# Patient Record
Sex: Female | Born: 1960
Health system: Southern US, Community
[De-identification: ages and names within clinical notes are randomized; demographics above are authoritative.]

## PROBLEM LIST (undated history)

## (undated) DIAGNOSIS — G4730 Sleep apnea, unspecified: Secondary | ICD-10-CM

## (undated) DIAGNOSIS — I10 Essential (primary) hypertension: Secondary | ICD-10-CM

## (undated) DIAGNOSIS — R011 Cardiac murmur, unspecified: Secondary | ICD-10-CM

## (undated) HISTORY — DX: Sleep apnea, unspecified: G47.30

## (undated) HISTORY — DX: Cardiac murmur, unspecified: R01.1

## (undated) HISTORY — DX: Essential (primary) hypertension: I10

## (undated) HISTORY — PX: MOUTH SURGERY: SHX715

## (undated) HISTORY — PX: KNEE SURGERY: SHX244

---

## 2015-09-24 ENCOUNTER — Ambulatory Visit (INDEPENDENT_AMBULATORY_CARE_PROVIDER_SITE_OTHER): Payer: BLUE CROSS/BLUE SHIELD | Admitting: Family Medicine

## 2015-09-24 VITALS — BP 144/86 | HR 66 | Temp 98.4°F | Resp 18 | Ht 60.0 in | Wt 132.0 lb

## 2015-09-24 DIAGNOSIS — G5711 Meralgia paresthetica, right lower limb: Secondary | ICD-10-CM

## 2015-09-24 DIAGNOSIS — S39012A Strain of muscle, fascia and tendon of lower back, initial encounter: Secondary | ICD-10-CM

## 2015-09-24 DIAGNOSIS — R202 Paresthesia of skin: Secondary | ICD-10-CM | POA: Diagnosis not present

## 2015-09-24 DIAGNOSIS — Z23 Encounter for immunization: Secondary | ICD-10-CM | POA: Diagnosis not present

## 2015-09-24 DIAGNOSIS — R200 Anesthesia of skin: Secondary | ICD-10-CM

## 2015-09-24 DIAGNOSIS — I10 Essential (primary) hypertension: Secondary | ICD-10-CM | POA: Diagnosis not present

## 2015-09-24 MED ORDER — CYCLOBENZAPRINE HCL 5 MG PO TABS: ORAL_TABLET | ORAL | Status: DC

## 2015-09-24 MED ORDER — TRAMADOL HCL 50 MG PO TABS: 50.0000 mg | ORAL_TABLET | Freq: Four times a day (QID) | ORAL | Status: DC | PRN

## 2015-09-24 NOTE — Progress Notes (Signed)
Subjective:  This chart was scribed for Merri Ray, MD by Leandra Kern, Medical Scribe. This patient was seen in Room 12 and the patient's care was started at 3:12 PM.   Patient ID: Sarah Beasley, female    DOB: 31-May-1961, 55 y.o.   MRN: [id number]  Chief Complaint  Patient presents with  . Back Pain    since saturday off and on   . Leg Pain    rt leg-lump under skin over a yr  . Flu Vaccine    HPI HPI Comments: Sarah Beasley is a 54 y.o. female who presents to [facility] complaining of multiple things:   Back pain:  Pt notes that the pain started about 5 days ago. Pt notes that the pain is intermittent and is localized to the lower left area of the back and radiates to the front of the hip, however not down the leg. She states that trying to sit down exacerbates the pain severely. She reports having symptoms of urinary frequency at the onset of the symptoms, however this has resolved now. She is also experiencing sleep disturbance due to the severity of the pain. Pt denies bowel or urinary incontinence, unexpected weight loss, fever, chills, hematuria, numbness, weakness in the legs, blisters or rash to the area. Pt took aleve and heat wraps, however she only found very mild relief with both. Pt recently moved to [place] from [place], and the moved has involved lifting heavy boxes, however she denies any traumatic injuries to the area.    Leg pain: Pt notes that she feels a lump under the skin on the front of the right thigh, first noticed at least a year ago. She believes that the lump has moved up in location. Pt states that she also experiences associated numbness to the area that started a few months ago. She indicates that the numbness is localized to the front of her thigh and it is not experienced on the posterior side of the thigh or down the leg.   Right hand numbness: Pt states that she usually does experience numbness to the area at night time, however  she indicates that she recently started experiencing the numbness and tingling sensation of the area during the day time such as on the way to work. Pt is right hand dominant. Pt denies pain to the wrist, increased use of computer mouse than normal, or a hx of carpal tunnel syndrome.  Pt is right handed   HTN:  Pt notes that she was diagnosed with HTN previously. Pt does not recall what medication she is currently using. Pt denies chest pain, trouble breathing, headache, or dizziness  Pt works in [employer].    There are no active problems to display for this patient.  Past Medical History  Diagnosis Date  . Hypertension    Past Surgical History  Procedure Laterality Date  . Knee surgery Left    No Known Allergies Prior to Admission medications   Not on File   Social History   Social History  . Marital Status: Single    Spouse Name: N/A  . Number of Children: N/A  . Years of Education: N/A   Occupational History  . Not on file.   Social History Main Topics  . Smoking status: Never Smoker   . Smokeless tobacco: Not on file  . Alcohol Use: 3.0 oz/week    5 Standard drinks or equivalent per week  . Drug Use: No  .  Sexual Activity: Not on file   Other Topics Concern  . Not on file   Social History Narrative  . No narrative on file    Review of Systems  Constitutional: Negative for fever, chills and unexpected weight change.  Respiratory: Negative for shortness of breath.   Cardiovascular: Negative for chest pain.  Genitourinary: Negative for hematuria.  Musculoskeletal: Positive for myalgias and back pain. Negative for arthralgias.  Skin: Negative for rash.  Neurological: Positive for numbness. Negative for dizziness, weakness and headaches.       Objective:   Physical Exam  Constitutional: She is oriented to person, place, and time. She appears well-developed and well-nourished. No distress.  HENT:  Head: Normocephalic and atraumatic.  Eyes:  Conjunctivae and EOM are normal. Pupils are equal, round, and reactive to light.  Neck: Neck supple. Carotid bruit is not present.  Cardiovascular: Normal rate, regular rhythm, normal heart sounds and intact distal pulses.   Pulmonary/Chest: Effort normal and breath sounds normal.  Abdominal: Soft. She exhibits no pulsatile midline mass. There is no tenderness.  Musculoskeletal:  Right wrist: FROM. No focal bony tenderness. Negative tannal of the wrist. Mild positive phalen with tingling into thump. No thenar wasting.   Back: Heal and toe walk is normal.Negative seated straight leg, pain only to the back, does not radiate to the leg. SI joint is non tender. Sciatica is non tender. Tender on the left paraspinal only, no bony tenderness.   Left Hip: Slight discomfort in the lower lumbar area towards SI joint with hip ROM, but negative faber, and negative fadir.  Neurological: She is alert and oriented to person, place, and time. No cranial nerve deficit. She displays no Babinski's sign on the right side. She displays no Babinski's sign on the left side.  Reflex Scores:      Patellar reflexes are 2+ on the right side.      Achilles reflexes are 2+ on the right side and 2+ on the left side. Lower extremity strength is equal bilaterally.    Skin: Skin is warm and dry.  Small, mobile, approximately 5-6 mm, soft nodule under the skin of the right thigh.     Psychiatric: She has a normal mood and affect. Her behavior is normal.  Nursing note and vitals reviewed.   Filed Vitals:   09/24/15 1456  BP: 144/86  Pulse: 66  Temp: 98.4 F (36.9 C)  TempSrc: Oral  Resp: 18  Height: 5' (1.524 m)  Weight: 132 lb (59.875 kg)  SpO2: 97%      Assessment & Plan:   Sarah Beasley is a 54 y.o. female Low back strain, initial encounter - Plan: cyclobenzaprine (FLEXERIL) 5 MG tablet, traMADol (ULTRAM) 50 MG tablet  - suspected overuse injury. No red flags on exam, and with relatively acute nature,  deferred imaging at this time.  - trial of Flexeril, tramadol if needed for breakthrough pain,  Handout on home exercise program and symptomatic care. RTC precautions given and if persistent, then would consider imaging.  Needs flu shot - Plan: Flu Vaccine QUAD 36+ mos IM given.   Numbness and tingling in right hand  -  Suspected mild carpal tunnel syndrome. Trial of over-the-counter wrist splint, can wear this initially at night or at work if needed. Recheck in the next few weeks if this persists.  Meralgia paraesthetica, right  - avoid constrictive clothing, change positions frequently throughout the day, and follow-up in a few weeks to discuss further. Sooner if worse.  Essential hypertension  - Borderline.  Check home blood pressures, and if remaining over 140/90, call with previous medication so I can send that in.  Follow-up sooner if worsening.  Meds ordered this encounter  Medications  . cyclobenzaprine (FLEXERIL) 5 MG tablet    Sig: 1 pill by mouth up to every 8 hours as needed. Start with one pill by mouth each bedtime as needed due to sedation    Dispense:  15 tablet    Refill:  0  . traMADol (ULTRAM) 50 MG tablet    Sig: Take 1 tablet (50 mg total) by mouth every 6 (six) hours as needed.    Dispense:  20 tablet    Refill:  0   Patient Instructions  Keep a record of your blood pressures outside of the office and bring them to the next office visit, and let me know what medicine and what dose you were on prior. If running over 140/90 persistently - return so we can restart medication.   I suspect you have a low back strain with a recent move, which may also cause some inflammation or pain into your sacroiliac joint. See information on both these conditions as printed below. Continue Advil or Aleve over-the-counter, Flexeril up to every 8 hours as needed for muscle spasm (this medicine can cause sedation), and if needed for breakthrough pain, I did write a short course of  tramadol if needed. This medicine also can cause sedation, so be careful combining it with other sedating medicines. If your pain is not improved within the next week to 2 weeks at the most, return to recheck with possible x-rays. Return sooner if any worsening of your symptoms.  Your leg pain may be a condition called meralgia paresthetica, which can be associated with tight waistbands or constrictive clothing. Change positions throughout the day and avoid any tight waistbands or constrictive clothing for the next 2 weeks to see if this will help with her symptoms. Follow-up in the next few weeks to discuss this further.  Your right wrist pain may be some mild carpal tunnel syndrome. See information on this condition below. You can try an over-the-counter wrist brace to wear at bedtime, and as needed throughout the day to see if this helps her symptoms. Follow-up in the next few weeks as above to discuss this further.    Low Back Strain With Rehab A strain is an injury in which a tendon or muscle is torn. The muscles and tendons of the lower back are vulnerable to strains. However, these muscles and tendons are very strong and require a great force to be injured. Strains are classified into three categories. Grade 1 strains cause pain, but the tendon is not lengthened. Grade 2 strains include a lengthened ligament, due to the ligament being stretched or partially ruptured. With grade 2 strains there is still function, although the function may be decreased. Grade 3 strains involve a complete tear of the tendon or muscle, and function is usually impaired. SYMPTOMS   Pain in the lower back.  Pain that affects one side more than the other.  Pain that gets worse with movement and may be felt in the hip, buttocks, or back of the thigh.  Muscle spasms of the muscles in the back.  Swelling along the muscles of the back.  Loss of strength of the back muscles.  Crackling sound (crepitation) when the  muscles are touched. CAUSES  Lower back strains occur when a force is placed on  the muscles or tendons that is greater than they can handle. Common causes of injury include:  Prolonged overuse of the muscle-tendon units in the lower back, usually from incorrect posture.  A single violent injury or force applied to the back. RISK INCREASES WITH:  Sports that involve twisting forces on the spine or a lot of bending at the waist (football, rugby, weightlifting, bowling, golf, tennis, speed skating, racquetball, swimming, running, gymnastics, diving).  Poor strength and flexibility.  Failure to warm up properly before activity.  Family history of lower back pain or disk disorders.  Previous back injury or surgery (especially fusion).  Poor posture with lifting, especially heavy objects.  Prolonged sitting, especially with poor posture. PREVENTION   Learn and use proper posture when sitting or lifting (maintain proper posture when sitting, lift using the knees and legs, not at the waist).  Warm up and stretch properly before activity.  Allow for adequate recovery between workouts.  Maintain physical fitness:  Strength, flexibility, and endurance.  Cardiovascular fitness. PROGNOSIS  If treated properly, lower back strains usually heal within 6 weeks. RELATED COMPLICATIONS   Recurring symptoms, resulting in a chronic problem.  Chronic inflammation, scarring, and partial muscle-tendon tear.  Delayed healing or resolution of symptoms.  Prolonged disability. TREATMENT  Treatment first involves the use of ice and medicine, to reduce pain and inflammation. The use of strengthening and stretching exercises may help reduce pain with activity. These exercises may be performed at home or with a therapist. Severe injuries may require referral to a therapist for further evaluation and treatment, such as ultrasound. Your caregiver may advise that you wear a back brace or corset, to help  reduce pain and discomfort. Often, prolonged bed rest results in greater harm then benefit. Corticosteroid injections may be recommended. However, these should be reserved for the most serious cases. It is important to avoid using your back when lifting objects. At night, sleep on your back on a firm mattress with a pillow placed under your knees. If non-surgical treatment is unsuccessful, surgery may be needed.  MEDICATION   If pain medicine is needed, nonsteroidal anti-inflammatory medicines (aspirin and ibuprofen), or other minor pain relievers (acetaminophen), are often advised.  Do not take pain medicine for 7 days before surgery.  Prescription pain relievers may be given, if your caregiver thinks they are needed. Use only as directed and only as much as you need.  Ointments applied to the skin may be helpful.  Corticosteroid injections may be given by your caregiver. These injections should be reserved for the most serious cases, because they may only be given a certain number of times. HEAT AND COLD  Cold treatment (icing) should be applied for 10 to 15 minutes every 2 to 3 hours for inflammation and pain, and immediately after activity that aggravates your symptoms. Use ice packs or an ice massage.  Heat treatment may be used before performing stretching and strengthening activities prescribed by your caregiver, physical therapist, or athletic trainer. Use a heat pack or a warm water soak. SEEK MEDICAL CARE IF:   Symptoms get worse or do not improve in 2 to 4 weeks, despite treatment.  You develop numbness, weakness, or loss of bowel or bladder function.  New, unexplained symptoms develop. (Drugs used in treatment may produce side effects.) EXERCISES  RANGE OF MOTION (ROM) AND STRETCHING EXERCISES - Low Back Strain Most people with lower back pain will find that their symptoms get worse with excessive bending forward (flexion) or arching  at the lower back (extension). The exercises  which will help resolve your symptoms will focus on the opposite motion.  Your physician, physical therapist or athletic trainer will help you determine which exercises will be most helpful to resolve your lower back pain. Do not complete any exercises without first consulting with your caregiver. Discontinue any exercises which make your symptoms worse until you speak to your caregiver.  If you have pain, numbness or tingling which travels down into your buttocks, leg or foot, the goal of the therapy is for these symptoms to move closer to your back and eventually resolve. Sometimes, these leg symptoms will get better, but your lower back pain may worsen. This is typically an indication of progress in your rehabilitation. Be very alert to any changes in your symptoms and the activities in which you participated in the 24 hours prior to the change. Sharing this information with your caregiver will allow him/her to most efficiently treat your condition.  These exercises may help you when beginning to rehabilitate your injury. Your symptoms may resolve with or without further involvement from your physician, physical therapist or athletic trainer. While completing these exercises, remember:  Restoring tissue flexibility helps normal motion to return to the joints. This allows healthier, less painful movement and activity.  An effective stretch should be held for at least 30 seconds.  A stretch should never be painful. You should only feel a gentle lengthening or release in the stretched tissue. FLEXION RANGE OF MOTION AND STRETCHING EXERCISES: STRETCH - Flexion, Single Knee to Chest   Lie on a firm bed or floor with both legs extended in front of you.  Keeping one leg in contact with the floor, bring your opposite knee to your chest. Hold your leg in place by either grabbing behind your thigh or at your knee.  Pull until you feel a gentle stretch in your lower back. Hold __________ seconds.  Slowly  release your grasp and repeat the exercise with the opposite side. Repeat __________ times. Complete this exercise __________ times per day.  STRETCH - Flexion, Double Knee to Chest   Lie on a firm bed or floor with both legs extended in front of you.  Keeping one leg in contact with the floor, bring your opposite knee to your chest.  Tense your stomach muscles to support your back and then lift your other knee to your chest. Hold your legs in place by either grabbing behind your thighs or at your knees.  Pull both knees toward your chest until you feel a gentle stretch in your lower back. Hold __________ seconds.  Tense your stomach muscles and slowly return one leg at a time to the floor. Repeat __________ times. Complete this exercise __________ times per day.  STRETCH - Low Trunk Rotation  Lie on a firm bed or floor. Keeping your legs in front of you, bend your knees so they are both pointed toward the ceiling and your feet are flat on the floor.  Extend your arms out to the side. This will stabilize your upper body by keeping your shoulders in contact with the floor.  Gently and slowly drop both knees together to one side until you feel a gentle stretch in your lower back. Hold for __________ seconds.  Tense your stomach muscles to support your lower back as you bring your knees back to the starting position. Repeat the exercise to the other side. Repeat __________ times. Complete this exercise __________ times per day  EXTENSION RANGE OF MOTION AND FLEXIBILITY EXERCISES: STRETCH - Extension, Prone on Elbows   Lie on your stomach on the floor, a bed will be too soft. Place your palms about shoulder width apart and at the height of your head.  Place your elbows under your shoulders. If this is too painful, stack pillows under your chest.  Allow your body to relax so that your hips drop lower and make contact more completely with the floor.  Hold this position for __________  seconds.  Slowly return to lying flat on the floor. Repeat __________ times. Complete this exercise __________ times per day.  RANGE OF MOTION - Extension, Prone Press Ups  Lie on your stomach on the floor, a bed will be too soft. Place your palms about shoulder width apart and at the height of your head.  Keeping your back as relaxed as possible, slowly straighten your elbows while keeping your hips on the floor. You may adjust the placement of your hands to maximize your comfort. As you gain motion, your hands will come more underneath your shoulders.  Hold this position __________ seconds.  Slowly return to lying flat on the floor. Repeat __________ times. Complete this exercise __________ times per day.  RANGE OF MOTION- Quadruped, Neutral Spine   Assume a hands and knees position on a firm surface. Keep your hands under your shoulders and your knees under your hips. You may place padding under your knees for comfort.  Drop your head and point your tail bone toward the ground below you. This will round out your lower back like an angry cat. Hold this position for __________ seconds.  Slowly lift your head and release your tail bone so that your back sags into a large arch, like an old horse.  Hold this position for __________ seconds.  Repeat this until you feel limber in your lower back.  Now, find your "sweet spot." This will be the most comfortable position somewhere between the two previous positions. This is your neutral spine. Once you have found this position, tense your stomach muscles to support your lower back.  Hold this position for __________ seconds. Repeat __________ times. Complete this exercise __________ times per day.  STRENGTHENING EXERCISES - Low Back Strain These exercises may help you when beginning to rehabilitate your injury. These exercises should be done near your "sweet spot." This is the neutral, low-back arch, somewhere between fully rounded and fully  arched, that is your least painful position. When performed in this safe range of motion, these exercises can be used for people who have either a flexion or extension based injury. These exercises may resolve your symptoms with or without further involvement from your physician, physical therapist or athletic trainer. While completing these exercises, remember:   Muscles can gain both the endurance and the strength needed for everyday activities through controlled exercises.  Complete these exercises as instructed by your physician, physical therapist or athletic trainer. Increase the resistance and repetitions only as guided.  You may experience muscle soreness or fatigue, but the pain or discomfort you are trying to eliminate should never worsen during these exercises. If this pain does worsen, stop and make certain you are following the directions exactly. If the pain is still present after adjustments, discontinue the exercise until you can discuss the trouble with your caregiver. STRENGTHENING - Deep Abdominals, Pelvic Tilt  Lie on a firm bed or floor. Keeping your legs in front of you, bend your knees so they are  both pointed toward the ceiling and your feet are flat on the floor.  Tense your lower abdominal muscles to press your lower back into the floor. This motion will rotate your pelvis so that your tail bone is scooping upwards rather than pointing at your feet or into the floor.  With a gentle tension and even breathing, hold this position for __________ seconds. Repeat __________ times. Complete this exercise __________ times per day.  STRENGTHENING - Abdominals, Crunches   Lie on a firm bed or floor. Keeping your legs in front of you, bend your knees so they are both pointed toward the ceiling and your feet are flat on the floor. Cross your arms over your chest.  Slightly tip your chin down without bending your neck.  Tense your abdominals and slowly lift your trunk high enough  to just clear your shoulder blades. Lifting higher can put excessive stress on the lower back and does not further strengthen your abdominal muscles.  Control your return to the starting position. Repeat __________ times. Complete this exercise __________ times per day.  STRENGTHENING - Quadruped, Opposite UE/LE Lift   Assume a hands and knees position on a firm surface. Keep your hands under your shoulders and your knees under your hips. You may place padding under your knees for comfort.  Find your neutral spine and gently tense your abdominal muscles so that you can maintain this position. Your shoulders and hips should form a rectangle that is parallel with the floor and is not twisted.  Keeping your trunk steady, lift your right hand no higher than your shoulder and then your left leg no higher than your hip. Make sure you are not holding your breath. Hold this position __________ seconds.  Continuing to keep your abdominal muscles tense and your back steady, slowly return to your starting position. Repeat with the opposite arm and leg. Repeat __________ times. Complete this exercise __________ times per day.  STRENGTHENING - Lower Abdominals, Double Knee Lift  Lie on a firm bed or floor. Keeping your legs in front of you, bend your knees so they are both pointed toward the ceiling and your feet are flat on the floor.  Tense your abdominal muscles to brace your lower back and slowly lift both of your knees until they come over your hips. Be certain not to hold your breath.  Hold __________ seconds. Using your abdominal muscles, return to the starting position in a slow and controlled manner. Repeat __________ times. Complete this exercise __________ times per day.  POSTURE AND BODY MECHANICS CONSIDERATIONS - Low Back Strain Keeping correct posture when sitting, standing or completing your activities will reduce the stress put on different body tissues, allowing injured tissues a chance to  heal and limiting painful experiences. The following are general guidelines for improved posture. Your physician or physical therapist will provide you with any instructions specific to your needs. While reading these guidelines, remember:  The exercises prescribed by your provider will help you have the flexibility and strength to maintain correct postures.  The correct posture provides the best environment for your joints to work. All of your joints have less wear and tear when properly supported by a spine with good posture. This means you will experience a healthier, less painful body.  Correct posture must be practiced with all of your activities, especially prolonged sitting and standing. Correct posture is as important when doing repetitive low-stress activities (typing) as it is when doing a single heavy-load activity (lifting).  RESTING POSITIONS Consider which positions are most painful for you when choosing a resting position. If you have pain with flexion-based activities (sitting, bending, stooping, squatting), choose a position that allows you to rest in a less flexed posture. You would want to avoid curling into a fetal position on your side. If your pain worsens with extension-based activities (prolonged standing, working overhead), avoid resting in an extended position such as sleeping on your stomach. Most people will find more comfort when they rest with their spine in a more neutral position, neither too rounded nor too arched. Lying on a non-sagging bed on your side with a pillow between your knees, or on your back with a pillow under your knees will often provide some relief. Keep in mind, being in any one position for a prolonged period of time, no matter how correct your posture, can still lead to stiffness. PROPER SITTING POSTURE In order to minimize stress and discomfort on your spine, you must sit with correct posture. Sitting with good posture should be effortless for a healthy  body. Returning to good posture is a gradual process. Many people can work toward this most comfortably by using various supports until they have the flexibility and strength to maintain this posture on their own. When sitting with proper posture, your ears will fall over your shoulders and your shoulders will fall over your hips. You should use the back of the chair to support your upper back. Your lower back will be in a neutral position, just slightly arched. You may place a small pillow or folded towel at the base of your lower back for support.  When working at a desk, create an environment that supports good, upright posture. Without extra support, muscles tire, which leads to excessive strain on joints and other tissues. Keep these recommendations in mind: CHAIR:  A chair should be able to slide under your desk when your back makes contact with the back of the chair. This allows you to work closely.  The chair's height should allow your eyes to be level with the upper part of your monitor and your hands to be slightly lower than your elbows. BODY POSITION  Your feet should make contact with the floor. If this is not possible, use a foot rest.  Keep your ears over your shoulders. This will reduce stress on your neck and lower back. INCORRECT SITTING POSTURES  If you are feeling tired and unable to assume a healthy sitting posture, do not slouch or slump. This puts excessive strain on your back tissues, causing more damage and pain. Healthier options include:  Using more support, like a lumbar pillow.  Switching tasks to something that requires you to be upright or walking.  Talking a brief walk.  Lying down to rest in a neutral-spine position. PROLONGED STANDING WHILE SLIGHTLY LEANING FORWARD  When completing a task that requires you to lean forward while standing in one place for a long time, place either foot up on a stationary 2-4 inch high object to help maintain the best posture.  When both feet are on the ground, the lower back tends to lose its slight inward curve. If this curve flattens (or becomes too large), then the back and your other joints will experience too much stress, tire more quickly, and can cause pain. CORRECT STANDING POSTURES Proper standing posture should be assumed with all daily activities, even if they only take a few moments, like when brushing your teeth. As in sitting, your ears  should fall over your shoulders and your shoulders should fall over your hips. You should keep a slight tension in your abdominal muscles to brace your spine. Your tailbone should point down to the ground, not behind your body, resulting in an over-extended swayback posture.  INCORRECT STANDING POSTURES  Common incorrect standing postures include a forward head, locked knees and/or an excessive swayback. WALKING Walk with an upright posture. Your ears, shoulders and hips should all line-up. PROLONGED ACTIVITY IN A FLEXED POSITION When completing a task that requires you to bend forward at your waist or lean over a low surface, try to find a way to stabilize 3 out of 4 of your limbs. You can place a hand or elbow on your thigh or rest a knee on the surface you are reaching across. This will provide you more stability so that your muscles do not fatigue as quickly. By keeping your knees relaxed, or slightly bent, you will also reduce stress across your lower back. CORRECT LIFTING TECHNIQUES DO :   Assume a wide stance. This will provide you more stability and the opportunity to get as close as possible to the object which you are lifting.  Tense your abdominals to brace your spine. Bend at the knees and hips. Keeping your back locked in a neutral-spine position, lift using your leg muscles. Lift with your legs, keeping your back straight.  Test the weight of unknown objects before attempting to lift them.  Try to keep your elbows locked down at your sides in order get the best  strength from your shoulders when carrying an object.  Always ask for help when lifting heavy or awkward objects. INCORRECT LIFTING TECHNIQUES DO NOT:   Lock your knees when lifting, even if it is a small object.  Bend and twist. Pivot at your feet or move your feet when needing to change directions.  Assume that you can safely pick up even a paper clip without proper posture.   This information is not intended to replace advice given to you by your health care provider. Make sure you discuss any questions you have with your health care provider.   Document Released: 10/24/2005 Document Revised: 11/14/2014 Document Reviewed: 02/05/2009 Elsevier Interactive Patient Education 2016 [place] Syndrome Carpal tunnel syndrome is a condition that causes pain in your hand and arm. The carpal tunnel is a narrow area located on the palm side of your wrist. Repeated wrist motion or certain diseases may cause swelling within the tunnel. This swelling pinches the main nerve in the wrist (median nerve). CAUSES  This condition may be caused by:   Repeated wrist motions.  Wrist injuries.  Arthritis.  A cyst or tumor in the carpal tunnel.  Fluid buildup during pregnancy. Sometimes the cause of this condition is not known.  RISK FACTORS This condition is more likely to develop in:   People who have jobs that cause them to repeatedly move their wrists in the same motion, such as butchers and cashiers.  Women.  People with certain conditions, such as:  Diabetes.  Obesity.  An underactive thyroid (hypothyroidism).  Kidney failure. SYMPTOMS  Symptoms of this condition include:   A tingling feeling in your fingers, especially in your thumb, index, and middle fingers.  Tingling or numbness in your hand.  An aching feeling in your entire arm, especially when your wrist and elbow are bent for long periods of time.  Wrist pain that goes up your arm to your  shoulder.  Pain that goes down into your palm or fingers.  A weak feeling in your hands. You may have trouble grabbing and holding items. Your symptoms may feel worse during the night.  DIAGNOSIS  This condition is diagnosed with a medical history and physical exam. You may also have tests, including:   An electromyogram (EMG). This test measures electrical signals sent by your nerves into the muscles.  X-rays. TREATMENT  Treatment for this condition includes:  Lifestyle changes. It is important to stop doing or modify the activity that caused your condition.  Physical or occupational therapy.  Medicines for pain and inflammation. This may include medicine that is injected into your wrist.  A wrist splint.  Surgery. HOME CARE INSTRUCTIONS  If You Have a Splint:  Wear it as told by your health care provider. Remove it only as told by your health care provider.  Loosen the splint if your fingers become numb and tingle, or if they turn cold and blue.  Keep the splint clean and dry. General Instructions  Take over-the-counter and prescription medicines only as told by your health care provider.  Rest your wrist from any activity that may be causing your pain. If your condition is work related, talk to your employer about changes that can be made, such as getting a wrist pad to use while typing.  If directed, apply ice to the painful area:  Put ice in a plastic bag.  Place a towel between your skin and the bag.  Leave the ice on for 20 minutes, 2-3 times per day.  Keep all follow-up visits as told by your health care provider. This is important.  Do any exercises as told by your health care provider, physical therapist, or occupational therapist. [place] IF:   You have new symptoms.  Your pain is not controlled with medicines.  Your symptoms get worse.   This information is not intended to replace advice given to you by your health care provider. Make  sure you discuss any questions you have with your health care provider.   Document Released: 10/21/2000 Document Revised: 07/15/2015 Document Reviewed: 03/11/2015 Elsevier Interactive Patient Education [employer].        By signing my name below, I, Rawaa Al Rifaie, attest that this documentation has been prepared under the direction and in the presence of Merri Ray, MD.  Royann Shivers Rifaie, Medical Scribe. 09/24/2015.  3:39 PM. I personally performed the services described in this documentation, which was scribed in my presence. The recorded information has been reviewed and considered, and addended by me as needed.

## 2015-09-24 NOTE — Patient Instructions (Signed)
Keep a record of your blood pressures outside of the office and bring them to the next office visit, and let me know what medicine and what dose you were on prior. If running over 140/90 persistently - return so we can restart medication.   I suspect you have a low back strain with a recent move, which may also cause some inflammation or pain into your sacroiliac joint. See information on both these conditions as printed below. Continue Advil or Aleve over-the-counter, Flexeril up to every 8 hours as needed for muscle spasm (this medicine can cause sedation), and if needed for breakthrough pain, I did write a short course of tramadol if needed. This medicine also can cause sedation, so be careful combining it with other sedating medicines. If your pain is not improved within the next week to 2 weeks at the most, return to recheck with possible x-rays. Return sooner if any worsening of your symptoms.  Your leg pain may be a condition called meralgia paresthetica, which can be associated with tight waistbands or constrictive clothing. Change positions throughout the day and avoid any tight waistbands or constrictive clothing for the next 2 weeks to see if this will help with her symptoms. Follow-up in the next few weeks to discuss this further.  Your right wrist pain may be some mild carpal tunnel syndrome. See information on this condition below. You can try an over-the-counter wrist brace to wear at bedtime, and as needed throughout the day to see if this helps her symptoms. Follow-up in the next few weeks as above to discuss this further.    Low Back Strain With Rehab A strain is an injury in which a tendon or muscle is torn. The muscles and tendons of the lower back are vulnerable to strains. However, these muscles and tendons are very strong and require a great force to be injured. Strains are classified into three categories. Grade 1 strains cause pain, but the tendon is not lengthened. Grade 2 strains  include a lengthened ligament, due to the ligament being stretched or partially ruptured. With grade 2 strains there is still function, although the function may be decreased. Grade 3 strains involve a complete tear of the tendon or muscle, and function is usually impaired. SYMPTOMS   Pain in the lower back.  Pain that affects one side more than the other.  Pain that gets worse with movement and may be felt in the hip, buttocks, or back of the thigh.  Muscle spasms of the muscles in the back.  Swelling along the muscles of the back.  Loss of strength of the back muscles.  Crackling sound (crepitation) when the muscles are touched. CAUSES  Lower back strains occur when a force is placed on the muscles or tendons that is greater than they can handle. Common causes of injury include:  Prolonged overuse of the muscle-tendon units in the lower back, usually from incorrect posture.  A single violent injury or force applied to the back. RISK INCREASES WITH:  Sports that involve twisting forces on the spine or a lot of bending at the waist (football, rugby, weightlifting, bowling, golf, tennis, speed skating, racquetball, swimming, running, gymnastics, diving).  Poor strength and flexibility.  Failure to warm up properly before activity.  Family history of lower back pain or disk disorders.  Previous back injury or surgery (especially fusion).  Poor posture with lifting, especially heavy objects.  Prolonged sitting, especially with poor posture. PREVENTION   Learn and use proper posture  when sitting or lifting (maintain proper posture when sitting, lift using the knees and legs, not at the waist).  Warm up and stretch properly before activity.  Allow for adequate recovery between workouts.  Maintain physical fitness:  Strength, flexibility, and endurance.  Cardiovascular fitness. PROGNOSIS  If treated properly, lower back strains usually heal within 6 weeks. RELATED  COMPLICATIONS   Recurring symptoms, resulting in a chronic problem.  Chronic inflammation, scarring, and partial muscle-tendon tear.  Delayed healing or resolution of symptoms.  Prolonged disability. TREATMENT  Treatment first involves the use of ice and medicine, to reduce pain and inflammation. The use of strengthening and stretching exercises may help reduce pain with activity. These exercises may be performed at home or with a therapist. Severe injuries may require referral to a therapist for further evaluation and treatment, such as ultrasound. Your caregiver may advise that you wear a back brace or corset, to help reduce pain and discomfort. Often, prolonged bed rest results in greater harm then benefit. Corticosteroid injections may be recommended. However, these should be reserved for the most serious cases. It is important to avoid using your back when lifting objects. At night, sleep on your back on a firm mattress with a pillow placed under your knees. If non-surgical treatment is unsuccessful, surgery may be needed.  MEDICATION   If pain medicine is needed, nonsteroidal anti-inflammatory medicines (aspirin and ibuprofen), or other minor pain relievers (acetaminophen), are often advised.  Do not take pain medicine for 7 days before surgery.  Prescription pain relievers may be given, if your caregiver thinks they are needed. Use only as directed and only as much as you need.  Ointments applied to the skin may be helpful.  Corticosteroid injections may be given by your caregiver. These injections should be reserved for the most serious cases, because they may only be given a certain number of times. HEAT AND COLD  Cold treatment (icing) should be applied for 10 to 15 minutes every 2 to 3 hours for inflammation and pain, and immediately after activity that aggravates your symptoms. Use ice packs or an ice massage.  Heat treatment may be used before performing stretching and  strengthening activities prescribed by your caregiver, physical therapist, or athletic trainer. Use a heat pack or a warm water soak. SEEK MEDICAL CARE IF:   Symptoms get worse or do not improve in 2 to 4 weeks, despite treatment.  You develop numbness, weakness, or loss of bowel or bladder function.  New, unexplained symptoms develop. (Drugs used in treatment may produce side effects.) EXERCISES  RANGE OF MOTION (ROM) AND STRETCHING EXERCISES - Low Back Strain Most people with lower back pain will find that their symptoms get worse with excessive bending forward (flexion) or arching at the lower back (extension). The exercises which will help resolve your symptoms will focus on the opposite motion.  Your physician, physical therapist or athletic trainer will help you determine which exercises will be most helpful to resolve your lower back pain. Do not complete any exercises without first consulting with your caregiver. Discontinue any exercises which make your symptoms worse until you speak to your caregiver.  If you have pain, numbness or tingling which travels down into your buttocks, leg or foot, the goal of the therapy is for these symptoms to move closer to your back and eventually resolve. Sometimes, these leg symptoms will get better, but your lower back pain may worsen. This is typically an indication of progress in your rehabilitation. Be  very alert to any changes in your symptoms and the activities in which you participated in the 24 hours prior to the change. Sharing this information with your caregiver will allow him/her to most efficiently treat your condition.  These exercises may help you when beginning to rehabilitate your injury. Your symptoms may resolve with or without further involvement from your physician, physical therapist or athletic trainer. While completing these exercises, remember:  Restoring tissue flexibility helps normal motion to return to the joints. This allows  healthier, less painful movement and activity.  An effective stretch should be held for at least 30 seconds.  A stretch should never be painful. You should only feel a gentle lengthening or release in the stretched tissue. FLEXION RANGE OF MOTION AND STRETCHING EXERCISES: STRETCH - Flexion, Single Knee to Chest   Lie on a firm bed or floor with both legs extended in front of you.  Keeping one leg in contact with the floor, bring your opposite knee to your chest. Hold your leg in place by either grabbing behind your thigh or at your knee.  Pull until you feel a gentle stretch in your lower back. Hold __________ seconds.  Slowly release your grasp and repeat the exercise with the opposite side. Repeat __________ times. Complete this exercise __________ times per day.  STRETCH - Flexion, Double Knee to Chest   Lie on a firm bed or floor with both legs extended in front of you.  Keeping one leg in contact with the floor, bring your opposite knee to your chest.  Tense your stomach muscles to support your back and then lift your other knee to your chest. Hold your legs in place by either grabbing behind your thighs or at your knees.  Pull both knees toward your chest until you feel a gentle stretch in your lower back. Hold __________ seconds.  Tense your stomach muscles and slowly return one leg at a time to the floor. Repeat __________ times. Complete this exercise __________ times per day.  STRETCH - Low Trunk Rotation  Lie on a firm bed or floor. Keeping your legs in front of you, bend your knees so they are both pointed toward the ceiling and your feet are flat on the floor.  Extend your arms out to the side. This will stabilize your upper body by keeping your shoulders in contact with the floor.  Gently and slowly drop both knees together to one side until you feel a gentle stretch in your lower back. Hold for __________ seconds.  Tense your stomach muscles to support your lower  back as you bring your knees back to the starting position. Repeat the exercise to the other side. Repeat __________ times. Complete this exercise __________ times per day  EXTENSION RANGE OF MOTION AND FLEXIBILITY EXERCISES: STRETCH - Extension, Prone on Elbows   Lie on your stomach on the floor, a bed will be too soft. Place your palms about shoulder width apart and at the height of your head.  Place your elbows under your shoulders. If this is too painful, stack pillows under your chest.  Allow your body to relax so that your hips drop lower and make contact more completely with the floor.  Hold this position for __________ seconds.  Slowly return to lying flat on the floor. Repeat __________ times. Complete this exercise __________ times per day.  RANGE OF MOTION - Extension, Prone Press Ups  Lie on your stomach on the floor, a bed will be too soft.  Place your palms about shoulder width apart and at the height of your head.  Keeping your back as relaxed as possible, slowly straighten your elbows while keeping your hips on the floor. You may adjust the placement of your hands to maximize your comfort. As you gain motion, your hands will come more underneath your shoulders.  Hold this position __________ seconds.  Slowly return to lying flat on the floor. Repeat __________ times. Complete this exercise __________ times per day.  RANGE OF MOTION- Quadruped, Neutral Spine   Assume a hands and knees position on a firm surface. Keep your hands under your shoulders and your knees under your hips. You may place padding under your knees for comfort.  Drop your head and point your tail bone toward the ground below you. This will round out your lower back like an angry cat. Hold this position for __________ seconds.  Slowly lift your head and release your tail bone so that your back sags into a large arch, like an old horse.  Hold this position for __________ seconds.  Repeat this until  you feel limber in your lower back.  Now, find your "sweet spot." This will be the most comfortable position somewhere between the two previous positions. This is your neutral spine. Once you have found this position, tense your stomach muscles to support your lower back.  Hold this position for __________ seconds. Repeat __________ times. Complete this exercise __________ times per day.  STRENGTHENING EXERCISES - Low Back Strain These exercises may help you when beginning to rehabilitate your injury. These exercises should be done near your "sweet spot." This is the neutral, low-back arch, somewhere between fully rounded and fully arched, that is your least painful position. When performed in this safe range of motion, these exercises can be used for people who have either a flexion or extension based injury. These exercises may resolve your symptoms with or without further involvement from your physician, physical therapist or athletic trainer. While completing these exercises, remember:   Muscles can gain both the endurance and the strength needed for everyday activities through controlled exercises.  Complete these exercises as instructed by your physician, physical therapist or athletic trainer. Increase the resistance and repetitions only as guided.  You may experience muscle soreness or fatigue, but the pain or discomfort you are trying to eliminate should never worsen during these exercises. If this pain does worsen, stop and make certain you are following the directions exactly. If the pain is still present after adjustments, discontinue the exercise until you can discuss the trouble with your caregiver. STRENGTHENING - Deep Abdominals, Pelvic Tilt  Lie on a firm bed or floor. Keeping your legs in front of you, bend your knees so they are both pointed toward the ceiling and your feet are flat on the floor.  Tense your lower abdominal muscles to press your lower back into the floor. This  motion will rotate your pelvis so that your tail bone is scooping upwards rather than pointing at your feet or into the floor.  With a gentle tension and even breathing, hold this position for __________ seconds. Repeat __________ times. Complete this exercise __________ times per day.  STRENGTHENING - Abdominals, Crunches   Lie on a firm bed or floor. Keeping your legs in front of you, bend your knees so they are both pointed toward the ceiling and your feet are flat on the floor. Cross your arms over your chest.  Slightly tip your chin down without  bending your neck.  Tense your abdominals and slowly lift your trunk high enough to just clear your shoulder blades. Lifting higher can put excessive stress on the lower back and does not further strengthen your abdominal muscles.  Control your return to the starting position. Repeat __________ times. Complete this exercise __________ times per day.  STRENGTHENING - Quadruped, Opposite UE/LE Lift   Assume a hands and knees position on a firm surface. Keep your hands under your shoulders and your knees under your hips. You may place padding under your knees for comfort.  Find your neutral spine and gently tense your abdominal muscles so that you can maintain this position. Your shoulders and hips should form a rectangle that is parallel with the floor and is not twisted.  Keeping your trunk steady, lift your right hand no higher than your shoulder and then your left leg no higher than your hip. Make sure you are not holding your breath. Hold this position __________ seconds.  Continuing to keep your abdominal muscles tense and your back steady, slowly return to your starting position. Repeat with the opposite arm and leg. Repeat __________ times. Complete this exercise __________ times per day.  STRENGTHENING - Lower Abdominals, Double Knee Lift  Lie on a firm bed or floor. Keeping your legs in front of you, bend your knees so they are both  pointed toward the ceiling and your feet are flat on the floor.  Tense your abdominal muscles to brace your lower back and slowly lift both of your knees until they come over your hips. Be certain not to hold your breath.  Hold __________ seconds. Using your abdominal muscles, return to the starting position in a slow and controlled manner. Repeat __________ times. Complete this exercise __________ times per day.  POSTURE AND BODY MECHANICS CONSIDERATIONS - Low Back Strain Keeping correct posture when sitting, standing or completing your activities will reduce the stress put on different body tissues, allowing injured tissues a chance to heal and limiting painful experiences. The following are general guidelines for improved posture. Your physician or physical therapist will provide you with any instructions specific to your needs. While reading these guidelines, remember:  The exercises prescribed by your provider will help you have the flexibility and strength to maintain correct postures.  The correct posture provides the best environment for your joints to work. All of your joints have less wear and tear when properly supported by a spine with good posture. This means you will experience a healthier, less painful body.  Correct posture must be practiced with all of your activities, especially prolonged sitting and standing. Correct posture is as important when doing repetitive low-stress activities (typing) as it is when doing a single heavy-load activity (lifting). RESTING POSITIONS Consider which positions are most painful for you when choosing a resting position. If you have pain with flexion-based activities (sitting, bending, stooping, squatting), choose a position that allows you to rest in a less flexed posture. You would want to avoid curling into a fetal position on your side. If your pain worsens with extension-based activities (prolonged standing, working overhead), avoid resting in an  extended position such as sleeping on your stomach. Most people will find more comfort when they rest with their spine in a more neutral position, neither too rounded nor too arched. Lying on a non-sagging bed on your side with a pillow between your knees, or on your back with a pillow under your knees will often provide some relief. Keep  in mind, being in any one position for a prolonged period of time, no matter how correct your posture, can still lead to stiffness. PROPER SITTING POSTURE In order to minimize stress and discomfort on your spine, you must sit with correct posture. Sitting with good posture should be effortless for a healthy body. Returning to good posture is a gradual process. Many people can work toward this most comfortably by using various supports until they have the flexibility and strength to maintain this posture on their own. When sitting with proper posture, your ears will fall over your shoulders and your shoulders will fall over your hips. You should use the back of the chair to support your upper back. Your lower back will be in a neutral position, just slightly arched. You may place a small pillow or folded towel at the base of your lower back for support.  When working at a desk, create an environment that supports good, upright posture. Without extra support, muscles tire, which leads to excessive strain on joints and other tissues. Keep these recommendations in mind: CHAIR:  A chair should be able to slide under your desk when your back makes contact with the back of the chair. This allows you to work closely.  The chair's height should allow your eyes to be level with the upper part of your monitor and your hands to be slightly lower than your elbows. BODY POSITION  Your feet should make contact with the floor. If this is not possible, use a foot rest.  Keep your ears over your shoulders. This will reduce stress on your neck and lower back. INCORRECT SITTING POSTURES   If you are feeling tired and unable to assume a healthy sitting posture, do not slouch or slump. This puts excessive strain on your back tissues, causing more damage and pain. Healthier options include:  Using more support, like a lumbar pillow.  Switching tasks to something that requires you to be upright or walking.  Talking a brief walk.  Lying down to rest in a neutral-spine position. PROLONGED STANDING WHILE SLIGHTLY LEANING FORWARD  When completing a task that requires you to lean forward while standing in one place for a long time, place either foot up on a stationary 2-4 inch high object to help maintain the best posture. When both feet are on the ground, the lower back tends to lose its slight inward curve. If this curve flattens (or becomes too large), then the back and your other joints will experience too much stress, tire more quickly, and can cause pain. CORRECT STANDING POSTURES Proper standing posture should be assumed with all daily activities, even if they only take a few moments, like when brushing your teeth. As in sitting, your ears should fall over your shoulders and your shoulders should fall over your hips. You should keep a slight tension in your abdominal muscles to brace your spine. Your tailbone should point down to the ground, not behind your body, resulting in an over-extended swayback posture.  INCORRECT STANDING POSTURES  Common incorrect standing postures include a forward head, locked knees and/or an excessive swayback. WALKING Walk with an upright posture. Your ears, shoulders and hips should all line-up. PROLONGED ACTIVITY IN A FLEXED POSITION When completing a task that requires you to bend forward at your waist or lean over a low surface, try to find a way to stabilize 3 out of 4 of your limbs. You can place a hand or elbow on your thigh or rest  a knee on the surface you are reaching across. This will provide you more stability so that your muscles do not  fatigue as quickly. By keeping your knees relaxed, or slightly bent, you will also reduce stress across your lower back. CORRECT LIFTING TECHNIQUES DO :   Assume a wide stance. This will provide you more stability and the opportunity to get as close as possible to the object which you are lifting.  Tense your abdominals to brace your spine. Bend at the knees and hips. Keeping your back locked in a neutral-spine position, lift using your leg muscles. Lift with your legs, keeping your back straight.  Test the weight of unknown objects before attempting to lift them.  Try to keep your elbows locked down at your sides in order get the best strength from your shoulders when carrying an object.  Always ask for help when lifting heavy or awkward objects. INCORRECT LIFTING TECHNIQUES DO NOT:   Lock your knees when lifting, even if it is a small object.  Bend and twist. Pivot at your feet or move your feet when needing to change directions.  Assume that you can safely pick up even a paper clip without proper posture.   This information is not intended to replace advice given to you by your health care provider. Make sure you discuss any questions you have with your health care provider.   Document Released: 10/24/2005 Document Revised: 11/14/2014 Document Reviewed: 02/05/2009 [employer] Interactive Patient Education 2016 [place] Syndrome Carpal tunnel syndrome is a condition that causes pain in your hand and arm. The carpal tunnel is a narrow area located on the palm side of your wrist. Repeated wrist motion or certain diseases may cause swelling within the tunnel. This swelling pinches the main nerve in the wrist (median nerve). CAUSES  This condition may be caused by:   Repeated wrist motions.  Wrist injuries.  Arthritis.  A cyst or tumor in the carpal tunnel.  Fluid buildup during pregnancy. Sometimes the cause of this condition is not known.  RISK  FACTORS This condition is more likely to develop in:   People who have jobs that cause them to repeatedly move their wrists in the same motion, such as butchers and cashiers.  Women.  People with certain conditions, such as:  Diabetes.  Obesity.  An underactive thyroid (hypothyroidism).  Kidney failure. SYMPTOMS  Symptoms of this condition include:   A tingling feeling in your fingers, especially in your thumb, index, and middle fingers.  Tingling or numbness in your hand.  An aching feeling in your entire arm, especially when your wrist and elbow are bent for long periods of time.  Wrist pain that goes up your arm to your shoulder.  Pain that goes down into your palm or fingers.  A weak feeling in your hands. You may have trouble grabbing and holding items. Your symptoms may feel worse during the night.  DIAGNOSIS  This condition is diagnosed with a medical history and physical exam. You may also have tests, including:   An electromyogram (EMG). This test measures electrical signals sent by your nerves into the muscles.  X-rays. TREATMENT  Treatment for this condition includes:  Lifestyle changes. It is important to stop doing or modify the activity that caused your condition.  Physical or occupational therapy.  Medicines for pain and inflammation. This may include medicine that is injected into your wrist.  A wrist splint.  Surgery. HOME CARE INSTRUCTIONS  If You Have a Splint:  Wear it as told by your health care provider. Remove it only as told by your health care provider.  Loosen the splint if your fingers become numb and tingle, or if they turn cold and blue.  Keep the splint clean and dry. General Instructions  Take over-the-counter and prescription medicines only as told by your health care provider.  Rest your wrist from any activity that may be causing your pain. If your condition is work related, talk to your employer about changes that can be  made, such as getting a wrist pad to use while typing.  If directed, apply ice to the painful area:  Put ice in a plastic bag.  Place a towel between your skin and the bag.  Leave the ice on for 20 minutes, 2-3 times per day.  Keep all follow-up visits as told by your health care provider. This is important.  Do any exercises as told by your health care provider, physical therapist, or occupational therapist. [place] IF:   You have new symptoms.  Your pain is not controlled with medicines.  Your symptoms get worse.   This information is not intended to replace advice given to you by your health care provider. Make sure you discuss any questions you have with your health care provider.   Document Released: 10/21/2000 Document Revised: 07/15/2015 Document Reviewed: 03/11/2015 Elsevier Interactive Patient Education [employer].

## 2015-10-12 ENCOUNTER — Encounter: Payer: Self-pay | Admitting: Family Medicine

## 2015-10-12 ENCOUNTER — Ambulatory Visit (INDEPENDENT_AMBULATORY_CARE_PROVIDER_SITE_OTHER): Payer: BLUE CROSS/BLUE SHIELD | Admitting: Family Medicine

## 2015-10-12 VITALS — BP 155/87 | HR 61 | Temp 98.4°F | Resp 16 | Ht 60.25 in | Wt 131.0 lb

## 2015-10-12 DIAGNOSIS — I10 Essential (primary) hypertension: Secondary | ICD-10-CM

## 2015-10-12 DIAGNOSIS — Z1211 Encounter for screening for malignant neoplasm of colon: Secondary | ICD-10-CM

## 2015-10-12 DIAGNOSIS — Z1239 Encounter for other screening for malignant neoplasm of breast: Secondary | ICD-10-CM

## 2015-10-12 LAB — BASIC METABOLIC PANEL
BUN: 11 mg/dL (ref 7–25)
CALCIUM: 9.7 mg/dL (ref 8.6–10.4)
CO2: 24 mmol/L (ref 20–31)
Chloride: 101 mmol/L (ref 98–110)
Creat: 0.58 mg/dL (ref 0.50–1.05)
GLUCOSE: 80 mg/dL (ref 65–99)
Potassium: 3.9 mmol/L (ref 3.5–5.3)
SODIUM: 136 mmol/L (ref 135–146)

## 2015-10-12 MED ORDER — LOSARTAN POTASSIUM 50 MG PO TABS: 50.0000 mg | ORAL_TABLET | Freq: Every day | ORAL | Status: DC

## 2015-10-12 NOTE — Progress Notes (Signed)
Subjective:    Patient ID: Sarah Beasley, female    DOB: 03-17-1961, 54 y.o.   MRN: [id number] By signing my name below, I, Zola Button, attest that this documentation has been prepared under the direction and in the presence of Merri Ray, MD.  Electronically Signed: Zola Button, Medical Scribe. 10/12/2015. 12:51 PM.  HPI HPI Comments: Sarah Beasley is a 54 y.o. female who presents to the [facility] for a follow-up. Patient is not fasting; she last ate around 8:00 AM. She has been thinking about establishing care here.  Hypertension: Previously taking losartan 100 mg up until a few months ago. Her blood pressure has been running around 130-140/~90 outside of the office. Patient denies lightheadedness, blood in stool, and leg swelling.  Low back pain: Follow-up. Low back strain. Suspected overuse injury on November 17th. Based on acute nature, imaging was deferred. She was tried on tramadol and flexeril as needed for breakthrough pain. Patient states her back pain has resolved.  Right hand numbness/tingling: Suspected CTS. Advised OTC splinting. She has not tried the splint yet because she forgot. She still has occasional numbness/tingling.  Health maintenance: Her last mammogram was 2 years ago. She has not had a colonoscopy. Her brother passed away from neuroendocrine tumor.  There are no active problems to display for this patient.  Past Medical History  Diagnosis Date  . Hypertension   . Heart murmur    Past Surgical History  Procedure Laterality Date  . Knee surgery Left    No Known Allergies Prior to Admission medications   Medication Sig Start Date End Date Taking? Authorizing Provider  cyclobenzaprine (FLEXERIL) 5 MG tablet 1 pill by mouth up to every 8 hours as needed. Start with one pill by mouth each bedtime as needed due to sedation Patient not taking: Reported on 10/12/2015 09/24/15   [name], MD  traMADol (ULTRAM) 50 MG tablet  Take 1 tablet (50 mg total) by mouth every 6 (six) hours as needed. Patient not taking: Reported on 10/12/2015 09/24/15   [name], MD   Social History   Social History  . Marital Status: Single    Spouse Name: N/A  . Number of Children: N/A  . Years of Education: N/A   Occupational History  . Not on file.   Social History Main Topics  . Smoking status: Never Smoker   . Smokeless tobacco: Not on file  . Alcohol Use: 3.0 oz/week    5 Standard drinks or equivalent per week  . Drug Use: No  . Sexual Activity: Not on file   Other Topics Concern  . Not on file   Social History Narrative     Review of Systems  Constitutional: Negative for fatigue and unexpected weight change.  Respiratory: Negative for chest tightness and shortness of breath.   Cardiovascular: Negative for chest pain, palpitations and leg swelling.  Gastrointestinal: Negative for abdominal pain and blood in stool.  Musculoskeletal: Negative for back pain.  Neurological: Positive for numbness. Negative for dizziness, syncope, light-headedness and headaches.       Objective:   Physical Exam  Constitutional: She is oriented to person, place, and time. She appears well-developed and well-nourished.  HENT:  Head: Normocephalic and atraumatic.  Eyes: Conjunctivae and EOM are normal. Pupils are equal, round, and reactive to light.  Neck: Carotid bruit is not present.  Cardiovascular: Normal rate, regular rhythm, normal heart sounds and intact distal pulses.   Pulmonary/Chest: Effort  normal and breath sounds normal.  Abdominal: Soft. She exhibits no pulsatile midline mass. There is no tenderness.  Neurological: She is alert and oriented to person, place, and time.  Skin: Skin is warm and dry.  Psychiatric: She has a normal mood and affect. Her behavior is normal.  Vitals reviewed.     Filed Vitals:   10/12/15 1148  BP: 155/87  Pulse: 61  Temp: 98.4 F (36.9 C)  TempSrc: Oral  Resp: 16  Height:  5' 0.25" (1.53 m)  Weight: 131 lb (59.421 kg)  SpO2: 97%       Assessment & Plan:   KAHEALANI VONDRAK is a 54 y.o. female Essential hypertension - Plan: losartan (COZAAR) 50 MG tablet, Basic metabolic panel  -initial trial of losartan 50mg  QD then can increase on own to 100mg  if not under 140/90 in next few weeks. Recheck in next 6 months for physical. Can check lipids at that time.   Screen for colon cancer - Plan: Ambulatory referral to Gastroenterology   Breast cancer screening - Plan: MM Digital Screening ordered.   Other health maintenance to be addressed at physical.    Meds ordered this encounter  Medications  . losartan (COZAAR) 50 MG tablet    Sig: Take 1 tablet (50 mg total) by mouth daily.    Dispense:  90 tablet    Refill:  1   Patient Instructions  Keep a record of your blood pressures outside of the office and remaining over 140/90 on the 50mg  losartan in next few weeks, can increase to 2 pills per day (total dose of 100mg ) and call me with this info.   We will schedule the mammogram and refer you to gastroenterologist for colonoscopy.   Wrist splint over the counter if needed for numbness/tingling in hand.   Follow up for a physical in next 6 months.   Return to the clinic or go to the nearest emergency room if any of your symptoms worsen or new symptoms occur.  .    I personally performed the services described in this documentation, which was scribed in my presence. The recorded information has been reviewed and considered, and addended by me as needed.

## 2015-10-12 NOTE — Patient Instructions (Addendum)
Keep a record of your blood pressures outside of the office and remaining over 140/90 on the 50mg  losartan in next few weeks, can increase to 2 pills per day (total dose of 100mg ) and call me with this info.   We will schedule the mammogram and refer you to gastroenterologist for colonoscopy.   Wrist splint over the counter if needed for numbness/tingling in hand.   Follow up for a physical in next 6 months.   Return to the clinic or go to the nearest emergency room if any of your symptoms worsen or new symptoms occur.  Marland Kitchen

## 2015-10-13 ENCOUNTER — Other Ambulatory Visit: Payer: Self-pay

## 2015-10-13 DIAGNOSIS — Z1231 Encounter for screening mammogram for malignant neoplasm of breast: Secondary | ICD-10-CM

## 2015-11-24 ENCOUNTER — Ambulatory Visit
Admission: RE | Admit: 2015-11-24 | Discharge: 2015-11-24 | Disposition: A | Discharge status: Home / Self Care | Payer: BLUE CROSS/BLUE SHIELD | Source: Ambulatory Visit | Attending: Family Medicine | Admitting: Family Medicine

## 2015-11-24 DIAGNOSIS — Z1231 Encounter for screening mammogram for malignant neoplasm of breast: Secondary | ICD-10-CM

## 2015-12-05 ENCOUNTER — Telehealth: Payer: Self-pay | Admitting: Family Medicine

## 2015-12-05 NOTE — Telephone Encounter (Signed)
lmom of pt new appt date Sarah Beasley has moved from [place] to [facility] 04-13-2016 @ 8:30

## 2016-02-18 ENCOUNTER — Ambulatory Visit (INDEPENDENT_AMBULATORY_CARE_PROVIDER_SITE_OTHER): Payer: BLUE CROSS/BLUE SHIELD | Admitting: Family Medicine

## 2016-02-18 VITALS — BP 136/72 | HR 62 | Temp 98.0°F | Resp 16 | Ht 59.5 in | Wt 131.0 lb

## 2016-02-18 DIAGNOSIS — S80212A Abrasion, left knee, initial encounter: Secondary | ICD-10-CM

## 2016-02-18 DIAGNOSIS — S60512A Abrasion of left hand, initial encounter: Secondary | ICD-10-CM

## 2016-02-18 DIAGNOSIS — R110 Nausea: Secondary | ICD-10-CM | POA: Diagnosis not present

## 2016-02-18 DIAGNOSIS — S0993XA Unspecified injury of face, initial encounter: Secondary | ICD-10-CM | POA: Diagnosis not present

## 2016-02-18 MED ORDER — GI COCKTAIL ~~LOC~~
30.0000 mL | Freq: Once | ORAL | Status: AC
  Administered 2016-02-18: 30 mL via ORAL

## 2016-02-18 NOTE — Progress Notes (Signed)
Subjective:    Patient ID: Sarah Beasley, female    DOB: 10-23-1961, 55 y.o.   MRN: [id number]  HPI This is a pleasant 55 yo female who presents today following a fall about 2.5 hours ago. She was walking on the side walk and her heel got stuck and she fell forward and fell on her left knee, left hand and left side of her face. Did not loose consciousness, no stars, no visual changes. No headache. She noticed some facial swelling of left cheek immediately after fall and applied ice with full resolution of swelling. No damage to teeth. No ear pain, no joaw pain. Feels a little nauseous and felt a little light headed with walking immediately following fall. Feels like acid reflux, thinks this is related to feeling stressed by fall. She had breakfast earlier today.   Past Medical History  Diagnosis Date  . Hypertension   . Heart murmur    Past Surgical History  Procedure Laterality Date  . Knee surgery Left    Family History  Problem Relation Age of Onset  . Hypertension Mother   . Cancer Mother   . Cancer Father   . Diabetes Father   . Heart disease Father   . Hypertension Father   . Hypertension Sister   . Cancer Brother   . Diabetes Brother   . Hypertension Brother   . Hypertension Sister    Social History  Substance Use Topics  . Smoking status: Never Smoker   . Smokeless tobacco: None  . Alcohol Use: 3.0 oz/week    5 Standard drinks or equivalent per week      Review of Systems Per HPI    Objective:   Physical Exam  Constitutional: She is oriented to person, place, and time. She appears well-developed and well-nourished. No distress.  HENT:  Head: Normocephalic and atraumatic.  Right Ear: External ear normal.  Left Ear: External ear normal.  Nose: Nose normal.  Mouth/Throat: Oropharynx is clear and moist.  No facial swelling, redness or bruising. No ear pain. No trauma to teeth.   Eyes: Conjunctivae and EOM are normal. Pupils are equal, round, and reactive  to light.  Neck: Normal range of motion. Neck supple.  Cardiovascular: Normal rate.   Musculoskeletal: Normal range of motion. She exhibits no edema.  Lymphadenopathy:    She has no cervical adenopathy.  Neurological: She is alert and oriented to person, place, and time.  Skin: Skin is warm and dry. She is not diaphoretic.  Left knee with 2 cm abrasion. Left lateral hand with small abrasion. Wounds cleaned with soap and water.   Psychiatric: She has a normal mood and affect. Her behavior is normal. Judgment and thought content normal.  Vitals reviewed.     BP 136/72 mmHg  Pulse 62  Temp(Src) 98 F (36.7 C) (Oral)  Resp 16  Ht 4' 11.5" (1.511 m)  Wt 131 lb (59.421 kg)  BMI 26.03 kg/m2  SpO2 98% Wt Readings from Last 3 Encounters:  02/18/16 131 lb (59.421 kg)  10/12/15 131 lb (59.421 kg)  09/24/15 132 lb (59.875 kg)       Assessment & Plan:  1. Nausea without vomiting - gi cocktail (Maalox,Lidocaine,Donnatal); Take 30 mLs by mouth once.  2. Facial injury, initial encounter - no apparent bruising, erythema, swelling. Encouraged her to continue to use ice for next 24 hours  3. Knee abrasion, left, initial encounter - instructed to keep clean and dry  4. Hand abrasion, left,  initial encounter - instructed to keep clean and dry  - Reviewed RTC precautions- severe headache, visual changes, dizziness, vomiting, chang in level of consciousness.    Clarene Reamer, FNP-BC  [facility]  02/18/2016 9:31 PM

## 2016-02-18 NOTE — Patient Instructions (Addendum)
Please return or go to the emergency room if you develop a severe headache, visual changes, severe vomiting  Continue to use ice several times throughout the day for your face, can also use on your knee and hand    IF you received an x-ray today, you will receive an invoice from [facility]. Please contact [facility] at [phone] with questions or concerns regarding your invoice.   IF you received labwork today, you will receive an invoice from [employer]. Please contact [facility] at [phone] with questions or concerns regarding your invoice.   Our billing staff will not be able to assist you with questions regarding bills from these companies.  You will be contacted with the lab results as soon as they are available. The fastest way to get your results is to activate your My Chart account. Instructions are located on the last page of this paperwork. If you have not heard from [place] regarding the results in 2 weeks, please contact this office.

## 2016-04-11 ENCOUNTER — Encounter: Payer: Self-pay | Admitting: Family Medicine

## 2016-04-13 ENCOUNTER — Encounter: Payer: BLUE CROSS/BLUE SHIELD | Admitting: Family Medicine

## 2016-04-14 ENCOUNTER — Encounter: Payer: Self-pay | Admitting: Family Medicine

## 2016-04-14 ENCOUNTER — Ambulatory Visit (INDEPENDENT_AMBULATORY_CARE_PROVIDER_SITE_OTHER): Payer: BLUE CROSS/BLUE SHIELD | Admitting: Family Medicine

## 2016-04-14 VITALS — BP 138/86 | HR 59 | Temp 98.0°F | Resp 16 | Ht 60.0 in | Wt 132.6 lb

## 2016-04-14 DIAGNOSIS — Z124 Encounter for screening for malignant neoplasm of cervix: Secondary | ICD-10-CM | POA: Diagnosis not present

## 2016-04-14 DIAGNOSIS — Z13220 Encounter for screening for lipoid disorders: Secondary | ICD-10-CM | POA: Diagnosis not present

## 2016-04-14 DIAGNOSIS — Z0000 Encounter for general adult medical examination without abnormal findings: Secondary | ICD-10-CM | POA: Diagnosis not present

## 2016-04-14 DIAGNOSIS — Z131 Encounter for screening for diabetes mellitus: Secondary | ICD-10-CM | POA: Diagnosis not present

## 2016-04-14 DIAGNOSIS — Z23 Encounter for immunization: Secondary | ICD-10-CM | POA: Diagnosis not present

## 2016-04-14 DIAGNOSIS — G4700 Insomnia, unspecified: Secondary | ICD-10-CM

## 2016-04-14 NOTE — Patient Instructions (Addendum)
IF you received an x-ray today, you will receive an invoice from [facility]. Please contact [facility] at [phone] with questions or concerns regarding your invoice.   IF you received labwork today, you will receive an invoice from [employer]. Please contact [facility] at [phone] with questions or concerns regarding your invoice.   Our billing staff will not be able to assist you with questions regarding bills from these companies.  You will be contacted with the lab results as soon as they are available. The fastest way to get your results is to activate your My Chart account. Instructions are located on the last page of this paperwork. If you have not heard from [place] regarding the results in 2 weeks, please contact this office.   I will refer you to sleep specialist, but if medication needed - can return to discuss these further.  Call [facility] to schedule colonoscopy. [id number].  If it has been 5 years since last pap test you are due. I can refer you to gynecologist as discussed.  Schedule appointment with eye care provider. Dr. Jerline Pain at [facility], [facility], or [facility].  Schedule dentist appointment. Keep a record of your blood pressures outside of the office and if over 140/90 - may need to restart medication.   Keeping You Healthy  Get These Tests  Blood Pressure- Have your blood pressure checked by your healthcare provider at least once a year.  Normal blood pressure is 120/80.  Weight- Have your body mass index (BMI) calculated to screen for obesity.  BMI is a measure of body fat based on height and weight.  You can calculate your own BMI at [employer].it  Cholesterol- Have your cholesterol checked every year.  Diabetes- Have your blood sugar checked every year if you have high blood pressure, high cholesterol, a family history of diabetes or if you are overweight.  Pap Test  - Have a pap test every 1 to 5 years if you have been sexually active.  If you are older than 65 and recent pap tests have been normal you may not need additional pap tests.  In addition, if you have had a hysterectomy  for benign disease additional pap tests are not necessary.  Mammogram-Yearly mammograms are essential for early detection of breast cancer  Screening for Colon Cancer- Colonoscopy starting at age 28. Screening may begin sooner depending on your family history and other health conditions.  Follow up colonoscopy as directed by your Gastroenterologist.  Screening for Osteoporosis- Screening begins at age 11 with bone density scanning, sooner if you are at higher risk for developing Osteoporosis.  Get these medicines  Calcium with Vitamin D- Your body requires 1200-1500 mg of Calcium a day and [phone] IU of Vitamin D a day.  You can only absorb 500 mg of Calcium at a time therefore Calcium must be taken in 2 or 3 separate doses throughout the day.  Hormones- Hormone therapy has been associated with increased risk for certain cancers and heart disease.  Talk to your healthcare provider about if you need relief from menopausal symptoms.  Aspirin- Ask your healthcare provider about taking Aspirin to prevent Heart Disease and Stroke.  Get these Immuniztions  Flu shot- Every fall  Pneumonia shot- Once after the age of 25; if you are younger ask your healthcare provider if you need a pneumonia shot.  Tetanus- Every ten years.  Zostavax- Once after the age of 47 to prevent shingles.  Take these steps  Don't smoke- Your healthcare provider can help you quit. For tips on how to quit, ask your healthcare provider or go to www.smokefree.gov or call 1-800 QUIT-NOW.  Be physically active- Exercise 5 days a week for a minimum of 30 minutes.  If you are not already physically active, start slow and gradually work up to 30 minutes of moderate physical activity.  Try walking, dancing, bike  riding, swimming, etc.  Eat a healthy diet- Eat a variety of healthy foods such as fruits, vegetables, whole grains, low fat milk, low fat cheeses, yogurt, lean meats, chicken, fish, eggs, dried beans, tofu, etc.  For more information go to www.thenutritionsource.org  Dental visit- Brush and floss teeth twice daily; visit your dentist twice a year.  Eye exam- Visit your Optometrist or Ophthalmologist yearly.  Drink alcohol in moderation- Limit alcohol intake to one drink or less a day.  Never drink and drive.  Depression- Your emotional health is as important as your physical health.  If you're feeling down or losing interest in things you normally enjoy, please talk to your healthcare provider.  Seat Belts- can save your life; always wear one  Smoke/Carbon Monoxide detectors- These detectors need to be installed on the appropriate level of your home.  Replace batteries at least once a year.  Violence- If anyone is threatening or hurting you, please tell your healthcare provider.  Living Will/ Health care power of attorney- Discuss with your healthcare provider and family.  Insomnia Insomnia is a sleep disorder that makes it difficult to fall asleep or to stay asleep. Insomnia can cause tiredness (fatigue), low energy, difficulty concentrating, mood swings, and poor performance at work or school.  There are three different ways to classify insomnia:  Difficulty falling asleep.  Difficulty staying asleep.  Waking up too early in the morning. Any type of insomnia can be long-term (chronic) or short-term (acute). Both are common. Short-term insomnia usually lasts for three months or less. Chronic insomnia occurs at least three times a week for longer than three months. CAUSES  Insomnia may be caused by another condition, situation, or substance, such as:  Anxiety.  Certain medicines.  Gastroesophageal reflux disease (GERD) or other gastrointestinal conditions.  Asthma or other  breathing conditions.  Restless legs syndrome, sleep apnea, or other sleep disorders.  Chronic pain.  Menopause. This may include hot flashes.  Stroke.  Abuse of alcohol, tobacco, or illegal drugs.  Depression.  Caffeine.   Neurological disorders, such as Alzheimer disease.  An overactive thyroid (hyperthyroidism). The cause of insomnia may not be known. RISK FACTORS Risk factors for insomnia include:  Gender. Women are more commonly affected than men.  Age. Insomnia is more common as you get older.  Stress. This may involve your professional or personal life.  Income. Insomnia is more common in people with lower income.  Lack of exercise.   Irregular work schedule or night shifts.  Traveling between different time zones. SIGNS AND SYMPTOMS If you have insomnia, trouble falling asleep or trouble staying asleep is the main symptom. This may lead to other symptoms, such as:  Feeling fatigued.  Feeling nervous about going to sleep.  Not feeling rested in the morning.  Having trouble concentrating.  Feeling irritable, anxious, or depressed. TREATMENT  Treatment for insomnia depends on the cause. If your insomnia is caused by an underlying condition, treatment will focus on addressing the condition. Treatment may also include:   Medicines to help you sleep.  Counseling or  therapy.  Lifestyle adjustments. HOME CARE INSTRUCTIONS   Take medicines only as directed by your health care provider.  Keep regular sleeping and waking hours. Avoid naps.  Keep a sleep diary to help you and your health care provider figure out what could be causing your insomnia. Include:   When you sleep.  When you wake up during the night.  How well you sleep.   How rested you feel the next day.  Any side effects of medicines you are taking.  What you eat and drink.   Make your bedroom a comfortable place where it is easy to fall asleep:  Put up shades or special  blackout curtains to block light from outside.  Use a white noise machine to block noise.  Keep the temperature cool.   Exercise regularly as directed by your health care provider. Avoid exercising right before bedtime.  Use relaxation techniques to manage stress. Ask your health care provider to suggest some techniques that may work well for you. These may include:  Breathing exercises.  Routines to release muscle tension.  Visualizing peaceful scenes.  Cut back on alcohol, caffeinated beverages, and cigarettes, especially close to bedtime. These can disrupt your sleep.  Do not overeat or eat spicy foods right before bedtime. This can lead to digestive discomfort that can make it hard for you to sleep.  Limit screen use before bedtime. This includes:  Watching TV.  Using your smartphone, tablet, and computer.  Stick to a routine. This can help you fall asleep faster. Try to do a quiet activity, brush your teeth, and go to bed at the same time each night.  Get out of bed if you are still awake after 15 minutes of trying to sleep. Keep the lights down, but try reading or doing a quiet activity. When you feel sleepy, go back to bed.  Make sure that you drive carefully. Avoid driving if you feel very sleepy.  Keep all follow-up appointments as directed by your health care provider. This is important. SEEK MEDICAL CARE IF:   You are tired throughout the day or have trouble in your daily routine due to sleepiness.  You continue to have sleep problems or your sleep problems get worse. SEEK IMMEDIATE MEDICAL CARE IF:   You have serious thoughts about hurting yourself or someone else.   This information is not intended to replace advice given to you by your health care provider. Make sure you discuss any questions you have with your health care provider.   Document Released: 10/21/2000 Document Revised: 07/15/2015 Document Reviewed: 07/25/2014 Elsevier Interactive Patient  Education [employer].

## 2016-04-14 NOTE — Progress Notes (Signed)
Subjective:  By signing my name below, I, [name], attest that this documentation has been prepared under the direction and in the presence of Merri Ray, MD. Electronically Signed: [name], [place]. 04/14/2016 , 9:27 AM .  Patient was seen in Room 25 .   Patient ID: Sarah Beasley, female    DOB: [birth year], 55 y.o.   MRN: [id number] Chief Complaint  Patient presents with  . Annual Exam    WITHOUT PAP SMEAR   HPI Sarah Beasley is a 55 y.o. female Here for annual physical. She will return for future lab work; she had a cup of coffee with cream and sugar prior to coming to appointment.   Cancer Screening Colonoscopy: She hasn't set up an appointment yet. [facility] gave her a call at the beginning of the year, but the patient states she was traveling at that time and hasn't made the call back yet.  Mammogram: she had a mammogram on January 17th, no evidence of malignancy; recheck 1 year.  Cervical cancer: she does not have an OBYGN. Her last pap smear was done 5 years ago, which was normal. She wants referral to OBGYN.   Depression Depression screen [facility] 2/9 04/14/2016 02/18/2016 10/12/2015 09/24/2015  Decreased Interest 0 0 0 0  Down, Depressed, Hopeless 0 0 0 0  PHQ - 2 Score 0 0 0 0    Immunizations Immunization History  Administered Date(s) Administered  . Influenza,inj,Quad PF,36+ Mos 09/24/2015   She doesn't recall her last tetanus shot. She would like to receive a tdap today.   Vision  Visual Acuity Screening   Right eye Left eye Both eyes  Without correction: 20/70 20/50 20/50   With correction:      She wears reading glasses. She denies having an optometrist at the moment.   Dentist She denies having a dentist.   Exercise She walks 1 hour, 3 times a week.   HTN See December 5th visit. She had been on losartan previously. Her BP was 155/87 in Dec, discussed restarting losartan at 50mg  qd. If remaining over 140/90, she could increase dose to  100mg  qd.   She denies taking the losartan. Her BP has been running 0000000 systolic. She had a meetings scheduled for today and even a meeting while driving to the clinic today.   Sleep disturbance Patient states only able to get a few hours of sleep at night with trouble staying asleep. For example, she reports going to sleep well last night, but woke up this morning at 2:30AM. She felt restful when she woke up. She's tried taking OTC unisom with temporary relief and felt more restful. She's also tried advil-pm in the past but felt tired next morning. She mentions snoring when she was younger. She notes feeling tired during the day sometimes. She's had trouble with sleep when she was younger as well and trained herself not to think too much. She only drinks 1 cup of coffee in the morning.   There are no active problems to display for this patient.  Past Medical History  Diagnosis Date  . Hypertension   . Heart murmur    Past Surgical History  Procedure Laterality Date  . Knee surgery Left    No Known Allergies Prior to Admission medications   Not on File   Social History   Social History  . Marital Status: Single    Spouse Name: N/A  . Number of Children: N/A  . Years of Education: N/A  Occupational History  . Not on file.   Social History Main Topics  . Smoking status: Never Smoker   . Smokeless tobacco: Not on file  . Alcohol Use: 3.0 oz/week    5 Standard drinks or equivalent per week  . Drug Use: No  . Sexual Activity: Not on file   Other Topics Concern  . Not on file   Social History Narrative   Exercise walking for 1 hour    Review of Systems 13 point ROS - Sleep disturbance    Objective:   Physical Exam  Constitutional: She is oriented to person, place, and time. She appears well-developed and well-nourished.  HENT:  Head: Normocephalic and atraumatic.  Right Ear: External ear normal.  Left Ear: External ear normal.  Mouth/Throat: Oropharynx is clear  and moist.  Eyes: Conjunctivae are normal. Pupils are equal, round, and reactive to light.  Neck: Normal range of motion. Neck supple. No thyromegaly present.  Cardiovascular: Normal rate, regular rhythm, normal heart sounds and intact distal pulses.   No murmur heard. Pulmonary/Chest: Effort normal and breath sounds normal. No respiratory distress. She has no wheezes.  Abdominal: Soft. Bowel sounds are normal. There is no tenderness.  Musculoskeletal: Normal range of motion. She exhibits no edema or tenderness.  Lymphadenopathy:    She has no cervical adenopathy.  Neurological: She is alert and oriented to person, place, and time.  Skin: Skin is warm and dry. No rash noted.  Psychiatric: She has a normal mood and affect. Her behavior is normal. Thought content normal.  Vitals reviewed.    Filed Vitals:   04/14/16 0850  BP: 138/86  Pulse: 59  Temp: 98 F (36.7 C)  TempSrc: Oral  Resp: 16  Height: 5' (1.524 m)  Weight: 132 lb 9.6 oz (60.147 kg)  SpO2: 98%      Assessment & Plan:   Sarah Beasley is a 55 y.o. female Annual physical exam  --anticipatory guidance as below in AVS, screening labs above. Health maintenance items as above in HPI discussed/recommended as applicable.   Insomnia - Plan: Ambulatory referral to Sleep Studies  -Refer for sleep study or eval with sleep specialist. Consider Belsomra if no OSA as early awakening is issue.   Cervical cancer screening - Plan: Ambulatory referral to Gynecology  -routine gynecologic care, due for pap. Referred.   Need for Tdap vaccination - Plan: Tdap vaccine greater than or equal to 7yo IM given  Screening for diabetes mellitus - Plan: COMPLETE METABOLIC PANEL WITH GFR   Screening for hyperlipidemia - Plan: Lipid panel   Meds ordered this encounter  Medications  . Multiple Vitamin (MULTIVITAMIN) tablet    Sig: Take 1 tablet by mouth daily.   Patient Instructions       IF you received an x-ray today, you will  receive an invoice from [facility]. Please contact [facility] at [phone] with questions or concerns regarding your invoice.   IF you received labwork today, you will receive an invoice from [employer]. Please contact [facility] at [phone] with questions or concerns regarding your invoice.   Our billing staff will not be able to assist you with questions regarding bills from these companies.  You will be contacted with the lab results as soon as they are available. The fastest way to get your results is to activate your My Chart account. Instructions are located on the last page of this paperwork. If you have not heard from [place] regarding the results  in 2 weeks, please contact this office.   I will refer you to sleep specialist, but if medication needed - can return to discuss these further.  Call [facility] to schedule colonoscopy. [id number].  If it has been 5 years since last pap test you are due. I can refer you to gynecologist as discussed.  Schedule appointment with eye care provider. Dr. Jerline Pain at [facility], [facility], or [facility].  Schedule dentist appointment. Keep a record of your blood pressures outside of the office and if over 140/90 - may need to restart medication.   Keeping You Healthy  Get These Tests  Blood Pressure- Have your blood pressure checked by your healthcare provider at least once a year.  Normal blood pressure is 120/80.  Weight- Have your body mass index (BMI) calculated to screen for obesity.  BMI is a measure of body fat based on height and weight.  You can calculate your own BMI at [employer].it  Cholesterol- Have your cholesterol checked every year.  Diabetes- Have your blood sugar checked every year if you have high blood pressure, high cholesterol, a family history of diabetes or if you are overweight.  Pap Test - Have a pap test every 1 to 5 years if you have  been sexually active.  If you are older than 65 and recent pap tests have been normal you may not need additional pap tests.  In addition, if you have had a hysterectomy  for benign disease additional pap tests are not necessary.  Mammogram-Yearly mammograms are essential for early detection of breast cancer  Screening for Colon Cancer- Colonoscopy starting at age 57. Screening may begin sooner depending on your family history and other health conditions.  Follow up colonoscopy as directed by your Gastroenterologist.  Screening for Osteoporosis- Screening begins at age 42 with bone density scanning, sooner if you are at higher risk for developing Osteoporosis.  Get these medicines  Calcium with Vitamin D- Your body requires 1200-1500 mg of Calcium a day and [phone] IU of Vitamin D a day.  You can only absorb 500 mg of Calcium at a time therefore Calcium must be taken in 2 or 3 separate doses throughout the day.  Hormones- Hormone therapy has been associated with increased risk for certain cancers and heart disease.  Talk to your healthcare provider about if you need relief from menopausal symptoms.  Aspirin- Ask your healthcare provider about taking Aspirin to prevent Heart Disease and Stroke.  Get these Immuniztions  Flu shot- Every fall  Pneumonia shot- Once after the age of 27; if you are younger ask your healthcare provider if you need a pneumonia shot.  Tetanus- Every ten years.  Zostavax- Once after the age of 40 to prevent shingles.  Take these steps  Don't smoke- Your healthcare provider can help you quit. For tips on how to quit, ask your healthcare provider or go to www.smokefree.gov or call 1-800 QUIT-NOW.  Be physically active- Exercise 5 days a week for a minimum of 30 minutes.  If you are not already physically active, start slow and gradually work up to 30 minutes of moderate physical activity.  Try walking, dancing, bike riding, swimming, etc.  Eat a healthy diet- Eat  a variety of healthy foods such as fruits, vegetables, whole grains, low fat milk, low fat cheeses, yogurt, lean meats, chicken, fish, eggs, dried beans, tofu, etc.  For more information go to www.thenutritionsource.org  Dental visit- Brush and floss teeth twice daily; visit  your dentist twice a year.  Eye exam- Visit your Optometrist or Ophthalmologist yearly.  Drink alcohol in moderation- Limit alcohol intake to one drink or less a day.  Never drink and drive.  Depression- Your emotional health is as important as your physical health.  If you're feeling down or losing interest in things you normally enjoy, please talk to your healthcare provider.  Seat Belts- can save your life; always wear one  Smoke/Carbon Monoxide detectors- These detectors need to be installed on the appropriate level of your home.  Replace batteries at least once a year.  Violence- If anyone is threatening or hurting you, please tell your healthcare provider.  Living Will/ Health care power of attorney- Discuss with your healthcare provider and family.  Insomnia Insomnia is a sleep disorder that makes it difficult to fall asleep or to stay asleep. Insomnia can cause tiredness (fatigue), low energy, difficulty concentrating, mood swings, and poor performance at work or school.  There are three different ways to classify insomnia:  Difficulty falling asleep.  Difficulty staying asleep.  Waking up too early in the morning. Any type of insomnia can be long-term (chronic) or short-term (acute). Both are common. Short-term insomnia usually lasts for three months or less. Chronic insomnia occurs at least three times a week for longer than three months. CAUSES  Insomnia may be caused by another condition, situation, or substance, such as:  Anxiety.  Certain medicines.  Gastroesophageal reflux disease (GERD) or other gastrointestinal conditions.  Asthma or other breathing conditions.  Restless legs syndrome,  sleep apnea, or other sleep disorders.  Chronic pain.  Menopause. This may include hot flashes.  Stroke.  Abuse of alcohol, tobacco, or illegal drugs.  Depression.  Caffeine.   Neurological disorders, such as Alzheimer disease.  An overactive thyroid (hyperthyroidism). The cause of insomnia may not be known. RISK FACTORS Risk factors for insomnia include:  Gender. Women are more commonly affected than men.  Age. Insomnia is more common as you get older.  Stress. This may involve your professional or personal life.  Income. Insomnia is more common in people with lower income.  Lack of exercise.   Irregular work schedule or night shifts.  Traveling between different time zones. SIGNS AND SYMPTOMS If you have insomnia, trouble falling asleep or trouble staying asleep is the main symptom. This may lead to other symptoms, such as:  Feeling fatigued.  Feeling nervous about going to sleep.  Not feeling rested in the morning.  Having trouble concentrating.  Feeling irritable, anxious, or depressed. TREATMENT  Treatment for insomnia depends on the cause. If your insomnia is caused by an underlying condition, treatment will focus on addressing the condition. Treatment may also include:   Medicines to help you sleep.  Counseling or therapy.  Lifestyle adjustments. HOME CARE INSTRUCTIONS   Take medicines only as directed by your health care provider.  Keep regular sleeping and waking hours. Avoid naps.  Keep a sleep diary to help you and your health care provider figure out what could be causing your insomnia. Include:   When you sleep.  When you wake up during the night.  How well you sleep.   How rested you feel the next day.  Any side effects of medicines you are taking.  What you eat and drink.   Make your bedroom a comfortable place where it is easy to fall asleep:  Put up shades or special blackout curtains to block light from outside.  Use  a white noise  machine to block noise.  Keep the temperature cool.   Exercise regularly as directed by your health care provider. Avoid exercising right before bedtime.  Use relaxation techniques to manage stress. Ask your health care provider to suggest some techniques that may work well for you. These may include:  Breathing exercises.  Routines to release muscle tension.  Visualizing peaceful scenes.  Cut back on alcohol, caffeinated beverages, and cigarettes, especially close to bedtime. These can disrupt your sleep.  Do not overeat or eat spicy foods right before bedtime. This can lead to digestive discomfort that can make it hard for you to sleep.  Limit screen use before bedtime. This includes:  Watching TV.  Using your smartphone, tablet, and computer.  Stick to a routine. This can help you fall asleep faster. Try to do a quiet activity, brush your teeth, and go to bed at the same time each night.  Get out of bed if you are still awake after 15 minutes of trying to sleep. Keep the lights down, but try reading or doing a quiet activity. When you feel sleepy, go back to bed.  Make sure that you drive carefully. Avoid driving if you feel very sleepy.  Keep all follow-up appointments as directed by your health care provider. This is important. SEEK MEDICAL CARE IF:   You are tired throughout the day or have trouble in your daily routine due to sleepiness.  You continue to have sleep problems or your sleep problems get worse. SEEK IMMEDIATE MEDICAL CARE IF:   You have serious thoughts about hurting yourself or someone else.   This information is not intended to replace advice given to you by your health care provider. Make sure you discuss any questions you have with your health care provider.   Document Released: 10/21/2000 Document Revised: 07/15/2015 Document Reviewed: 07/25/2014 Elsevier Interactive Patient Education [employer].     I personally  performed the services described in this documentation, which was scribed in my presence. The recorded information has been reviewed and considered, and addended by me as needed.   Signed,   Merri Ray, MD [facility].  04/16/2016 9:48 PM

## 2016-04-26 ENCOUNTER — Institutional Professional Consult (permissible substitution): Payer: BLUE CROSS/BLUE SHIELD | Admitting: Neurology

## 2016-05-05 ENCOUNTER — Institutional Professional Consult (permissible substitution): Payer: BLUE CROSS/BLUE SHIELD | Admitting: Neurology

## 2016-05-26 ENCOUNTER — Ambulatory Visit (INDEPENDENT_AMBULATORY_CARE_PROVIDER_SITE_OTHER): Payer: BLUE CROSS/BLUE SHIELD | Admitting: Neurology

## 2016-05-26 ENCOUNTER — Encounter: Payer: Self-pay | Admitting: Neurology

## 2016-05-26 VITALS — BP 140/78 | HR 72 | Resp 16 | Ht 60.0 in | Wt 132.0 lb

## 2016-05-26 DIAGNOSIS — R0683 Snoring: Secondary | ICD-10-CM

## 2016-05-26 DIAGNOSIS — G4710 Hypersomnia, unspecified: Secondary | ICD-10-CM | POA: Diagnosis not present

## 2016-05-26 DIAGNOSIS — G4750 Parasomnia, unspecified: Secondary | ICD-10-CM

## 2016-05-26 DIAGNOSIS — R351 Nocturia: Secondary | ICD-10-CM | POA: Diagnosis not present

## 2016-05-26 DIAGNOSIS — G478 Other sleep disorders: Secondary | ICD-10-CM

## 2016-05-26 NOTE — Progress Notes (Signed)
Subjective:    Patient ID: Sarah Beasley is a 55 y.o. female.  HPI     Star Age, MD, PhD [facility] [address], [address] [place], [place] [address]  Dear Dr. Carlota Raspberry,   I saw your patient, Sarah Beasley, upon your kind request in my neurologic clinic today for initial consultation of her sleep disorder. The patient is unaccompanied today. As you know, Sarah Beasley is a 55 year old right-handed woman with an underlying medical history of hypertension, and borderline overweight state, who reports difficulty with her sleep, particularly trouble staying asleep. She also reports daytime tiredness and occasional snoring. She has tried over-the-counter p.m. type medication and Unisom with marginal success. She does not drink much in the way of caffeine, usually just one cup of coffee in the morning. I reviewed your office note from 04/14/2016. She was advised regarding sleep hygiene and insomnia management at the time.  She has been using Unisom as needed especially when she has had a few nights of not good sleep. Overall, she reports a several year history of difficulty staying asleep. It seems to have become worse over the past couple of years. She has reduced her caffeine intake. She lives alone, is single, no children, no pets. She does not smoke cigarettes. She drinks alcohol in the form of wine, mostly on weekends. She drinks one cup of coffee per day typically. She has not been exercising regularly and would like to lose about 10-15 pounds she says. She snores and has been told in the past that snoring can be loud. She denies morning headaches and restless leg symptoms but has nocturia about once or twice per night. She is postmenopausal. She denies any significant stress. Sometimes she drives to [place] or [place] where she is originally from. She has noted some drowsiness at times during meetings or while driving. She has had infrequent episodes of  sleep paralysis. She has no history of cataplexy or hypnagogic or hypnopompic hallucinations or sleep attacks. She has some sleep talking and wonders if she has had some sleepwalking as well. She has no family history of obstructive sleep apnea. She tries to be in bed between 8:30 and 9 typically and watches TV in bed and TV tends to stay on all night, sometimes she turns it off when she wakes up in the middle of the night. Wake up time is between 5 and 5:30 AM. She does not wake up rested.  Her Epworth sleepiness score is 10 out of 24 today, her fatigue score is 43 out of 63.  Her Past Medical History Is Significant For: Past Medical History  Diagnosis Date  . Hypertension   . Heart murmur     Her Past Surgical History Is Significant For: Past Surgical History  Procedure Laterality Date  . Knee surgery Left     Her Family History Is Significant For: Family History  Problem Relation Age of Onset  . Hypertension Mother   . Cancer Mother   . Cancer Father   . Diabetes Father   . Heart disease Father   . Hypertension Father   . Hypertension Sister   . Cancer Brother   . Diabetes Brother   . Hypertension Brother   . Hypertension Sister     Her Social History Is Significant For: Social History   Social History  . Marital Status: Single    Spouse Name: N/A  . Number of Children: 0  . Years of Education:  BA   Social History Main Topics  . Smoking status: Never Smoker   . Smokeless tobacco: None  . Alcohol Use: 3.0 oz/week    5 Standard drinks or equivalent per week  . Drug Use: No  . Sexual Activity: Not Asked   Other Topics Concern  . None   Social History Narrative   Exercise walking for 1 hour   Drinks 1 cup of coffee in the morning.     Her Allergies Are:  No Known Allergies:   Her Current Medications Are:  Outpatient Encounter Prescriptions as of 05/26/2016  Medication Sig  . Multiple Vitamin (MULTIVITAMIN) tablet Take 1 tablet by mouth daily.  Marland Kitchen  doxylamine, Sleep, (UNISOM) 25 MG tablet Take 25 mg by mouth at bedtime as needed.   No facility-administered encounter medications on file as of 05/26/2016.  :  Review of Systems:  Out of a complete 14 point review of systems, all are reviewed and negative with the exception of these symptoms as listed below:   Review of Systems  Neurological:       Patient has trouble staying asleep, snoring, wakes up feeling tired, daytime tiredness, decreased energy   Epworth Sleepiness Scale 0= would never doze 1= slight chance of dozing 2= moderate chance of dozing 3= high chance of dozing  Sitting and reading:2 Watching TV:2 Sitting inactive in a public place (ex. Theater or meeting):1 As a passenger in a car for an hour without a break:2 Lying down to rest in the afternoon:0 Sitting and talking to someone:0 Sitting quietly after lunch (no alcohol): 1 In a car, while stopped in traffic:2 Total:10  Objective:  Neurologic Exam  Physical Exam Physical Examination:   Filed Vitals:   05/26/16 0953  BP: 140/78  Pulse: 72  Resp: 16   General Examination: The patient is a very pleasant 55 y.o. female in no acute distress. She appears well-developed and well-nourished and well groomed.   HEENT: Normocephalic, atraumatic, pupils are equal, round and reactive to light and accommodation. Funduscopic exam is normal with sharp disc margins noted. Extraocular tracking is good without limitation to gaze excursion or nystagmus noted. Normal smooth pursuit is noted. Hearing is grossly intact. Tympanic membranes are clear bilaterally. Face is symmetric with normal facial animation and normal facial sensation. Speech is clear with no dysarthria noted. There is no hypophonia. There is no lip, neck/head, jaw or voice tremor. Neck is supple with full range of passive and active motion. There are no carotid bruits on auscultation. Oropharynx exam reveals: mild mouth dryness, good dental hygiene and mild airway  crowding, due to smaller airway entry and tonsils in place. Mallampati is class I. Tongue protrudes centrally and palate elevates symmetrically. Tonsils are 1+ in size. Neck size is 13inches. She has a mild overbite.   Chest: Clear to auscultation without wheezing, rhonchi or crackles noted.  Heart: S1+S2+0, regular and normal without murmurs, rubs or gallops noted.   Abdomen: Soft, non-tender and non-distended with normal bowel sounds appreciated on auscultation.  Extremities: There is no pitting edema in the distal lower extremities bilaterally. Pedal pulses are intact.  Skin: Warm and dry without trophic changes noted. There are no varicose veins.  Musculoskeletal: exam reveals no obvious joint deformities, tenderness or joint swelling or erythema.   Neurologically:  Mental status: The patient is awake, alert and oriented in all 4 spheres. Her immediate and remote memory, attention, language skills and fund of knowledge are appropriate. There is no evidence of aphasia, agnosia,  apraxia or anomia. Speech is clear with normal prosody and enunciation. Thought process is linear. Mood is normal and affect is normal.  Cranial nerves II - XII are as described above under HEENT exam. In addition: shoulder shrug is normal with equal shoulder height noted. Motor exam: Normal bulk, strength and tone is noted. There is no drift, tremor or rebound. Romberg is negative. Reflexes are 2+ throughout. Babinski: Toes are flexor bilaterally. Fine motor skills and coordination: intact with normal finger taps, normal hand movements, normal rapid alternating patting, normal foot taps and normal foot agility.  Cerebellar testing: No dysmetria or intention tremor on finger to nose testing. Heel to shin is unremarkable bilaterally. There is no truncal or gait ataxia.  Sensory exam: intact to light touch, pinprick, vibration, temperature sense in the upper and lower extremities.  Gait, station and balance: She stands  easily. No veering to one side is noted. No leaning to one side is noted. Posture is age-appropriate and stance is narrow based. Gait shows normal stride length and normal pace. No problems turning are noted. Tandem walk is unremarkable.   Assessment and Plan:  In summary, Sarah Beasley is a very pleasant 55 y.o.-year old female with an underlying medical history of hypertension, and borderline overweight state, who reports a long-standing history with difficulty with her sleep, particularly sleep maintenance issues. She has a history of snoring, reports sleep paralysis, parasomnias as well. While she does not have a telltale history for obstructive sleep apnea, it is worthwhile looking with an attended sleep study for an organic cause of her sleep problems. She reports nocturia, not waking up rested and tiredness during the day, no telltale history for narcolepsy. I had a long chat with the patient about my findings and the diagnosis of OSA, its prognosis and treatment options. We talked about medical treatments, surgical interventions and non-pharmacological approaches. I explained in particular the risks and ramifications of untreated moderate to severe OSA, especially with respect to developing cardiovascular disease down the Road, including congestive heart failure, difficult to treat hypertension, cardiac arrhythmias, or stroke. Even type 2 diabetes has, in part, been linked to untreated OSA. Symptoms of untreated OSA include daytime sleepiness, memory problems, mood irritability and mood disorder such as depression and anxiety, lack of energy, as well as recurrent headaches, especially morning headaches. We talked about trying to maintain a healthy lifestyle in general, as well as the importance of weight control. I encouraged the patient to eat healthy, exercise daily and keep well hydrated, to keep a scheduled bedtime and wake time routine, to not skip any meals and eat healthy snacks in between  meals. I advised the patient not to drive when feeling sleepy. I recommended the following at this time: sleep study with potential positive airway pressure titration. (We will score hypopneas at 3% and split the sleep study into diagnostic and treatment portion, if the estimated. 2 hour AHI is >15/h).   I explained the sleep test procedure to the patient and also outlined possible surgical and non-surgical treatment options of OSA, including CPAP, the use of a custom-made dental device (which would require a referral to a specialist dentist or oral surgeon), upper airway surgical options, such as pillar implants, radiofrequency surgery, tongue base surgery, and UPPP (which would involve a referral to an ENT surgeon). Rarely, jaw surgery such as mandibular advancement may be considered.  I answered all her questions today and the patient was in agreement. I would like to see her back  after the sleep study is completed and encouraged her to call with any interim questions, concerns, problems or updates.   Thank you very much for allowing me to participate in the care of this nice patient. If I can be of any further assistance to you please do not hesitate to call me at [phone].  Sincerely,   Star Age, MD, PhD

## 2016-05-26 NOTE — Patient Instructions (Addendum)
Based on your symptoms and your exam I believe you may be at some risk obstructive sleep apnea or OSA, and I think we should proceed with a sleep study to determine whether you do or do not have OSA and how severe it is. If you have more than mild OSA, I want you to consider treatment with CPAP. Please remember, the risks and ramifications of moderate to severe obstructive sleep apnea or OSA are: Cardiovascular disease, including congestive heart failure, stroke, difficult to control hypertension, arrhythmias, and even type 2 diabetes has been linked to untreated OSA. Sleep apnea causes disruption of sleep and sleep deprivation in most cases, which, in turn, can cause recurrent headaches, problems with memory, mood, concentration, focus, and vigilance. Most people with untreated sleep apnea report excessive daytime sleepiness, which can affect their ability to drive. Please do not drive if you feel sleepy.   I will likely see you back after your sleep study to go over the test results and where to go from there. We will call you after your sleep study to advise about the results (most likely, you will hear from Beverlee Nims, my nurse) and to set up an appointment at the time, as necessary.    We will look for sleep talking, leg twitching what type of sleep you are getting.   Our sleep lab administrative assistant, Arrie Aran will meet with you or call you to schedule your sleep study. If you don't hear back from her by next week please feel free to call her at [phone]. This is her direct line and please leave a message with your phone number to call back if you get the voicemail box. She will call back as soon as possible.

## 2016-06-15 ENCOUNTER — Ambulatory Visit (INDEPENDENT_AMBULATORY_CARE_PROVIDER_SITE_OTHER): Payer: BLUE CROSS/BLUE SHIELD | Admitting: Neurology

## 2016-06-15 DIAGNOSIS — G4720 Circadian rhythm sleep disorder, unspecified type: Secondary | ICD-10-CM

## 2016-06-15 DIAGNOSIS — G4710 Hypersomnia, unspecified: Secondary | ICD-10-CM | POA: Diagnosis not present

## 2016-06-15 DIAGNOSIS — G4733 Obstructive sleep apnea (adult) (pediatric): Secondary | ICD-10-CM

## 2016-06-15 DIAGNOSIS — G479 Sleep disorder, unspecified: Secondary | ICD-10-CM

## 2016-06-16 ENCOUNTER — Telehealth: Payer: Self-pay | Admitting: Neurology

## 2016-06-16 NOTE — Telephone Encounter (Signed)
Patient referred by Dr. Carlota Raspberry, seen by me on 05/26/16, diagnostic PSG on 06/15/16.   Please call and notify the patient that the recent sleep study did show overall mild obstructive sleep apnea. Please inform patient that I would like to go over the details of the study and options during a follow up appointment. Arrange a followup appointment. Also, route or fax report to PCP and referring MD, if other than PCP.  Once you have spoken to patient, you can close this encounter.   Thanks,  Star Age, MD, PhD [facility])

## 2016-06-20 NOTE — Telephone Encounter (Signed)
LM for patient to call back.

## 2016-06-22 NOTE — Telephone Encounter (Signed)
Pt returned Diana's call. Pt was advised a msg was left on VM, she said she did not check it before she called. f/u appt was scheduled for 9/21 and pt will check VM and call back if she has any questions

## 2016-06-22 NOTE — Telephone Encounter (Signed)
Per DPR. I left message with results. I asked for patient to call back and make regular f/u with Dr. Rexene Alberts to discuss results. Report sent to PCP.

## 2016-06-23 ENCOUNTER — Encounter: Payer: Self-pay | Admitting: Neurology

## 2016-06-23 ENCOUNTER — Ambulatory Visit (INDEPENDENT_AMBULATORY_CARE_PROVIDER_SITE_OTHER): Payer: BLUE CROSS/BLUE SHIELD | Admitting: Neurology

## 2016-06-23 VITALS — BP 130/86 | HR 64 | Ht 60.0 in | Wt 133.0 lb

## 2016-06-23 DIAGNOSIS — G479 Sleep disorder, unspecified: Secondary | ICD-10-CM

## 2016-06-23 DIAGNOSIS — G4733 Obstructive sleep apnea (adult) (pediatric): Secondary | ICD-10-CM | POA: Diagnosis not present

## 2016-06-23 NOTE — Progress Notes (Signed)
Subjective:    Patient ID: Sarah Beasley is a 55 y.o. female.  HPI     Interim history:   Sarah Beasley is a 55 year old right-handed woman with an underlying medical history of hypertension, and borderline overweight state, who presents for follow-up consultation of her sleep disturbance, after her recent sleep study. The patient is unaccompanied today. I first met her on 05/26/2016 at the request of her primary care physician, at which time she reported difficulty with her sleep in particular trouble staying asleep, some snoring, and nonrestorative sleep. I invited her back for sleep study. She had a diagnostic sleep study on 06/15/2016. I went over her test results with her in detail today. Sleep efficiency was 52.9% with a latency to sleep of 75.5 minutes and wake after sleep onset of 161 minutes with moderate to severe sleep fragmentation noted. She had an increased percentage of light stage sleep, absence of slow-wave sleep and a decreased percentage of REM sleep at 15.6% with a normal REM latency. She had no significant PLMS, EKG or EEG changes. Mild intermittent snoring was noted area total AHI was 9.7 per hour, rising to 14.5 per hour during REM sleep and 12.5 per hour in the supine position. Average oxygen saturation was 95%, nadir was 91%.  Today, 06/23/2016: She reports no new symptoms, no worsening or improvement of her sleep disturbance, this dates back many years, she has sleep interruption and nonrestorative sleep, trouble staying asleep primarily, takes Unisom very sparingly. She denies restless leg symptoms.   Previously:  05/26/2016: She reports difficulty with her sleep, particularly trouble staying asleep. She also reports daytime tiredness and occasional snoring. She has tried over-the-counter p.m. type medication and Unisom with marginal success. She does not drink much in the way of caffeine, usually just one cup of coffee in the morning. I reviewed your office note from  04/14/2016. She was advised regarding sleep hygiene and insomnia management at the time.   She has been using Unisom as needed especially when she has had a few nights of not good sleep. Overall, she reports a several year history of difficulty staying asleep. It seems to have become worse over the past couple of years. She has reduced her caffeine intake. She lives alone, is single, no children, no pets. She does not smoke cigarettes. She drinks alcohol in the form of wine, mostly on weekends. She drinks one cup of coffee per day typically. She has not been exercising regularly and would like to lose about 10-15 pounds she says. She snores and has been told in the past that snoring can be loud. She denies morning headaches and restless leg symptoms but has nocturia about once or twice per night. She is postmenopausal. She denies any significant stress. Sometimes she drives to [place] or [place] where she is originally from. She has noted some drowsiness at times during meetings or while driving. She has had infrequent episodes of sleep paralysis. She has no history of cataplexy or hypnagogic or hypnopompic hallucinations or sleep attacks. She has some sleep talking and wonders if she has had some sleepwalking as well. She has no family history of obstructive sleep apnea. She tries to be in bed between 8:30 and 9 typically and watches TV in bed and TV tends to stay on all night, sometimes she turns it off when she wakes up in the middle of the night. Wake up time is between 5 and 5:30 AM. She does not wake up rested.  Her Epworth sleepiness score is 10 out of 24 today, her fatigue score is 43 out of 63.  Her Past Medical History Is Significant For: Past Medical History:  Diagnosis Date  . Heart murmur   . Hypertension     Her Past Surgical History Is Significant For: Past Surgical History:  Procedure Laterality Date  . KNEE SURGERY Left     Her Family History Is Significant For: Family  History  Problem Relation Age of Onset  . Hypertension Mother   . Cancer Mother   . Cancer Father   . Diabetes Father   . Heart disease Father   . Hypertension Father   . Hypertension Sister   . Cancer Brother   . Diabetes Brother   . Hypertension Brother   . Hypertension Sister     Her Social History Is Significant For: Social History   Social History  . Marital status: Single    Spouse name: N/A  . Number of children: 0  . Years of education: BA   Social History Main Topics  . Smoking status: Never Smoker  . Smokeless tobacco: None  . Alcohol use 3.0 oz/week    5 Standard drinks or equivalent per week  . Drug use: No  . Sexual activity: Not Asked   Other Topics Concern  . None   Social History Narrative   Exercise walking for 1 hour   Drinks 1 cup of coffee in the morning.     Her Allergies Are:  No Known Allergies:   Her Current Medications Are:  Outpatient Encounter Prescriptions as of 06/23/2016  Medication Sig  . doxylamine, Sleep, (UNISOM) 25 MG tablet Take 25 mg by mouth at bedtime as needed.  . Multiple Vitamin (MULTIVITAMIN) tablet Take 1 tablet by mouth daily.   No facility-administered encounter medications on file as of 06/23/2016.   :  Review of Systems:  Out of a complete 14 point review of systems, all are reviewed and negative with the exception of these symptoms as listed below: Review of Systems  Neurological:       Patient here alone. Sleep study, here to discuss results. Denies new problems, issues. Use of Unisom really varies, not taking often.     Objective:  Neurologic Exam  Physical Exam Physical Examination:   Vitals:   06/23/16 1103 06/23/16 1104  BP: 130/86 130/86  Pulse: 64    General Examination: The patient is a very pleasant 55 y.o. female in no acute distress. She appears well-developed and well-nourished and well groomed.   HEENT: Normocephalic, atraumatic, pupils are equal, round and reactive to light and  accommodation. Funduscopic exam is normal with sharp disc margins noted. Extraocular tracking is good without limitation to gaze excursion or nystagmus noted. Normal smooth pursuit is noted. Hearing is grossly intact. Face is symmetric with normal facial animation and normal facial sensation. Speech is clear with no dysarthria noted. There is no hypophonia. There is no lip, neck/head, jaw or voice tremor. Neck is supple with full range of passive and active motion. There are no carotid bruits on auscultation. Oropharynx exam reveals: mild mouth dryness, good dental hygiene and mild airway crowding, due to smaller airway entry and tonsils in place. Mallampati is class I. Tongue protrudes centrally and palate elevates symmetrically. Tonsils are 1+ in size.   Chest: Clear to auscultation without wheezing, rhonchi or crackles noted.  Heart: S1+S2+0, regular and normal without murmurs, rubs or gallops noted.   Abdomen: Soft, non-tender and non-distended with  normal bowel sounds appreciated on auscultation.  Extremities: There is no pitting edema in the distal lower extremities bilaterally. Pedal pulses are intact.  Skin: Warm and dry without trophic changes noted. There are no varicose veins.  Musculoskeletal: exam reveals no obvious joint deformities, tenderness or joint swelling or erythema.   Neurologically:   Mental status: The patient is awake, alert and oriented in all 4 spheres. Her immediate and remote memory, attention, language skills and fund of knowledge are appropriate. There is no evidence of aphasia, agnosia, apraxia or anomia. Speech is clear with normal prosody and enunciation. Thought process is linear. Mood is normal and affect is normal.  Cranial nerves II - XII are as described above under HEENT exam. In addition: shoulder shrug is normal with equal shoulder height noted. Motor exam: Normal bulk, strength and tone is noted. There is no drift, tremor or rebound. Romberg is negative.  Reflexes are 2+ throughout. Babinski: Toes are flexor bilaterally. Fine motor skills and coordination: intact with normal finger taps, normal hand movements, normal rapid alternating patting, normal foot taps and normal foot agility.  Cerebellar testing: No dysmetria or intention tremor on finger to nose testing. Heel to shin is unremarkable bilaterally. There is no truncal or gait ataxia.  Sensory exam: intact to light touch, pinprick, vibration, temperature sense in the upper and lower extremities.  Gait, station and balance: She stands easily. No veering to one side is noted. No leaning to one side is noted. Posture is age-appropriate and stance is narrow based. Gait shows normal stride length and normal pace. No problems turning are noted. Tandem walk is unremarkable.   Assessment and Plan:  In summary, Sarah Beasley is a very pleasant 55 year old female with an underlying medical history of hypertension, and borderline overweight state, who presents for follow up consultation of her longstanding history with difficulty with her sleep, particularly sleep maintenance issues. She has a history of snoring, has had some sleep paralysis, but not lately. She had a sleep study on 06/15/2016 which showed mild sleep disordered breathing, significant sleep disruption, increase in light stage sleep, absence of deep sleep, and reduction in rem sleep. Most of these are nonspecific findings, nevertheless, because of her sleep related complaints, we can certainly try positive airway pressure treatment for her mild sleep apnea and see if she feels better, particularly with respect to sleep quality, waking better rested, sleep consolidation, she does have nocturia about once or twice per average night. We talked about her sleep study results in detail, exam is stable, she is agreeable to pursuing AutoPap therapy for at least 3 months, I will see her back after that and we will see how she feels and how things are going  at the time. I placed an order for AutoPap therapy and we will fax this to a DME company locally. She had no preference with regards to her DME provider. I will see her back in about 3 months, sooner as needed. I answered all her questions today and she was in agreement.  I spent 25 minutes in total face-to-face time with the patient, more than 50% of which was spent in counseling and coordination of care, reviewing test results, reviewing medication and discussing or reviewing the diagnosis of OSA, its prognosis and treatment options.

## 2016-06-23 NOTE — Patient Instructions (Signed)
We will set you up at home with a so called autoPAP machine for treatment of your overall mild obstructive sleep apnea.   I will see you back in about 3 months to see how you feel and how things are going.

## 2016-07-01 DIAGNOSIS — G4733 Obstructive sleep apnea (adult) (pediatric): Secondary | ICD-10-CM | POA: Diagnosis not present

## 2016-07-28 ENCOUNTER — Ambulatory Visit: Payer: BLUE CROSS/BLUE SHIELD | Admitting: Neurology

## 2016-08-01 DIAGNOSIS — G4733 Obstructive sleep apnea (adult) (pediatric): Secondary | ICD-10-CM | POA: Diagnosis not present

## 2016-08-31 DIAGNOSIS — G4733 Obstructive sleep apnea (adult) (pediatric): Secondary | ICD-10-CM | POA: Diagnosis not present

## 2016-09-27 ENCOUNTER — Ambulatory Visit: Payer: BLUE CROSS/BLUE SHIELD | Admitting: Neurology

## 2016-10-01 DIAGNOSIS — G4733 Obstructive sleep apnea (adult) (pediatric): Secondary | ICD-10-CM | POA: Diagnosis not present

## 2016-10-31 DIAGNOSIS — G4733 Obstructive sleep apnea (adult) (pediatric): Secondary | ICD-10-CM | POA: Diagnosis not present

## 2016-12-01 ENCOUNTER — Telehealth: Payer: Self-pay

## 2016-12-01 DIAGNOSIS — G4733 Obstructive sleep apnea (adult) (pediatric): Secondary | ICD-10-CM | POA: Diagnosis not present

## 2016-12-01 NOTE — Telephone Encounter (Signed)
LM for patient to bring CPAP machine or SD card in to appt on Monday.

## 2016-12-05 ENCOUNTER — Telehealth: Payer: Self-pay

## 2016-12-05 ENCOUNTER — Ambulatory Visit: Payer: BLUE CROSS/BLUE SHIELD | Admitting: Neurology

## 2016-12-05 NOTE — Telephone Encounter (Signed)
Patient did not show to appt today  

## 2016-12-07 ENCOUNTER — Encounter: Payer: Self-pay | Admitting: Neurology

## 2016-12-14 ENCOUNTER — Ambulatory Visit (INDEPENDENT_AMBULATORY_CARE_PROVIDER_SITE_OTHER): Payer: BLUE CROSS/BLUE SHIELD | Admitting: Family Medicine

## 2016-12-14 VITALS — BP 156/82 | HR 56 | Temp 98.2°F | Resp 16

## 2016-12-14 DIAGNOSIS — M791 Myalgia, unspecified site: Secondary | ICD-10-CM

## 2016-12-14 MED ORDER — MELOXICAM 15 MG PO TABS: 15.0000 mg | ORAL_TABLET | Freq: Every day | ORAL | 0 refills | Status: DC

## 2016-12-14 NOTE — Progress Notes (Signed)
Patient ID: Sarah Beasley, female    DOB: 01/04/1961, 56 y.o.   MRN: [id number]  PCP: No primary care provider on file.  Chief Complaint  Patient presents with  . Arm Pain    left arm pain/bug bite? cold sx's x 1 week resolved    Subjective:  HPI 56 year old female presents for evaluation of left arm pain following a respiratory illness that has resolved x 1 week. Reports that she experienced acute left arm pain and heaviness x 5 days ago with a sore throat. During the same period of time pt reports flu-like symptoms of generalized aches, chills, fatigue, and a low grade fever Tmax 100.7.  Respiratory symptoms resolved, left arm pain persisted. Reports she felt the a section of her upper left arm today was swollen and heavy which feels almost like a "flu shot". Feels there is a small "red" bump that may be related to an insect bite.   Social History   Social History  . Marital status: Single    Spouse name: N/A  . Number of children: 0  . Years of education: BA   Occupational History  . Not on file.   Social History Main Topics  . Smoking status: Never Smoker  . Smokeless tobacco: Not on file  . Alcohol use 3.0 oz/week    5 Standard drinks or equivalent per week  . Drug use: No  . Sexual activity: Not on file   Other Topics Concern  . Not on file   Social History Narrative   Exercise walking for 1 hour   Drinks 1 cup of coffee in the morning.     Family History  Problem Relation Age of Onset  . Hypertension Mother   . Cancer Mother   . Cancer Father   . Diabetes Father   . Heart disease Father   . Hypertension Father   . Hypertension Sister   . Cancer Brother   . Diabetes Brother   . Hypertension Brother   . Hypertension Sister    Review of Systems See HPI  Prior to Admission medications   Medication Sig Start Date End Date Taking? Authorizing Provider  doxylamine, Sleep, (UNISOM) 25 MG tablet Take 25 mg by mouth at bedtime as needed.    Historical  Provider, MD  Multiple Vitamin (MULTIVITAMIN) tablet Take 1 tablet by mouth daily.    Historical Provider, MD    Past Medical, Surgical Family and Social History reviewed and updated.    Objective:   Today's Vitals   12/14/16 1801  BP: (!) 156/82  Pulse: (!) 56  Resp: 16  Temp: 98.2 F (36.8 C)  TempSrc: Oral  SpO2: 99%    Wt Readings from Last 3 Encounters:  06/23/16 133 lb (60.3 kg)  05/26/16 132 lb (59.9 kg)  04/14/16 132 lb 9.6 oz (60.1 kg)   Physical Exam  Constitutional: She is oriented to person, place, and time. She appears well-developed and well-nourished.  HENT:  Head: Normocephalic and atraumatic.  Right Ear: External ear normal.  Left Ear: External ear normal.  Nose: Nose normal.  Mouth/Throat: Oropharynx is clear and moist.  Eyes: Pupils are equal, round, and reactive to light.  Neck: Normal range of motion. Neck supple.  Cardiovascular: Normal rate, regular rhythm, normal heart sounds and intact distal pulses.   Pulmonary/Chest: Effort normal.  Musculoskeletal: Normal range of motion.  Complete ROM -passive and active "pt reports heaviness no burning" or tenderness with palpation  Neurological: She is  alert and oriented to person, place, and time.  Skin: Skin is warm and dry. No rash noted. No erythema.  Left upper arm negative abnormal erythema or lesions No rash present   Psychiatric: She has a normal mood and affect. Her behavior is normal. Judgment and thought content normal.      Assessment & Plan:  1. Myalgia Shingles vs cellulitis vs muscle aches post viral illness  Treat with Meloxicam 15 mg daily until pain resolves.  If aches improve, likely just a residual viral myalgia   Return for follow-up if symptoms persist.  Carroll Sage. Kenton Kingfisher, MSN, FNP-C [facility]

## 2016-12-14 NOTE — Patient Instructions (Addendum)
Start Meloxicam 15 mg once daily for next couple of days. If pain worsens or other symptoms develop within the left arm, please return for care. Return for care if arm pain is still present on Saturday.  IF you received an x-ray today, you will receive an invoice from [facility]. Please contact [facility] at [phone] with questions or concerns regarding your invoice.   IF you received labwork today, you will receive an invoice from [facility]. Please contact [facility] at [phone] with questions or concerns regarding your invoice.   Our billing staff will not be able to assist you with questions regarding bills from these companies.  You will be contacted with the lab results as soon as they are available. The fastest way to get your results is to activate your My Chart account. Instructions are located on the last page of this paperwork. If you have not heard from [place] regarding the results in 2 weeks, please contact this office.     Muscle Pain, Adult Muscle pain (myalgia) may be mild or severe. In most cases, the pain lasts only a short time and it goes away without treatment. It is normal to feel some muscle pain after starting a workout program. Muscles that have not been used often will be sore at first. Muscle pain may also be caused by many other things, including:  Overuse or muscle strain, especially if you are not in shape. This is the most common cause of muscle pain.  Injury.  Bruises.  Viruses, such as the flu.  Infectious diseases.  A chronic condition that causes muscle tenderness, fatigue, and headache (fibromyalgia).  A condition, such as lupus, in which the body's disease-fighting system attacks other organs in the body (autoimmune or rheumatologic diseases).  Certain drugs, including ACE inhibitors and statins. To diagnose the cause of your muscle pain, your health care provider will do a physical exam and ask questions about the pain and when  it began. If you have not had muscle pain for very long, your health care provider may want to wait before doing much testing. If your muscle pain has lasted a long time, your health care provider may want to run tests right away. In some cases, this may include tests to rule out certain conditions or illnesses. Treatment for muscle pain depends on the cause. Home care is often enough to relieve muscle pain. Your health care provider may also prescribe anti-inflammatory medicine. Follow these instructions at home: Activity  If overuse is causing your muscle pain:  Slow down your activities until the pain goes away.  Do regular, gentle exercises if you are not usually active.  Warm up before exercising. Stretch before and after exercising. This can help lower the risk of muscle pain.  Do not continue working out if the pain is very bad. Bad pain could mean that you have injured a muscle. Managing pain and discomfort  If directed, apply ice to the sore muscle:  Put ice in a plastic bag.  Place a towel between your skin and the bag.  Leave the ice on for 20 minutes, 2-3 times a day.  You may also alternate between applying ice and applying heat as told by your health care provider. To apply heat, use the heat source that your health care provider recommends, such as a moist heat pack or a heating pad.  Place a towel between your skin and the heat source.  Leave the heat on for 20-30 minutes.  Remove the  heat if your skin turns bright red. This is especially important if you are unable to feel pain, heat, or cold. You may have a greater risk of getting burned. Medicines  Take over-the-counter and prescription medicines only as told by your health care provider.  Do not drive or use heavy machinery while taking prescription pain medicine. Contact a health care provider if:  Your muscle pain gets worse and medicines do not help.  You have muscle pain that lasts longer than 3  days.  You have a rash or fever along with muscle pain.  You have muscle pain after a tick bite.  You have muscle pain while working out, even though you are in good physical condition.  You have redness, soreness, or swelling along with muscle pain.  You have muscle pain after starting a new medicine or changing the dose of a medicine. Get help right away if:  You have trouble breathing.  You have trouble swallowing.  You have muscle pain along with a stiff neck, fever, and vomiting.  You have severe muscle weakness or cannot move part of your body. This information is not intended to replace advice given to you by your health care provider. Make sure you discuss any questions you have with your health care provider. Document Released: 09/15/2006 Document Revised: 05/13/2016 Document Reviewed: 03/15/2016 Elsevier Interactive Patient Education  2017 [place] is an infection that causes a painful skin rash and fluid-filled blisters. Shingles is caused by the same virus that causes chickenpox. Shingles only develops in people who:  Have had chickenpox.  Have gotten the chickenpox vaccine. (This is rare.) The first symptoms of shingles may be itching, tingling, or pain in an area on your skin. A rash will follow in a few days or weeks. The rash is usually on one side of the body in a bandlike or beltlike pattern. Over time, the rash turns into fluid-filled blisters that break open, scab over, and dry up. Medicines may:  Help you manage pain.  Help you recover more quickly.  Help to prevent long-term problems. Follow these instructions at home: Medicines  Take medicines only as told by your doctor.  Apply an anti-itch or numbing cream to the affected area as told by your doctor. Blister and Rash Care  Take a cool bath or put cool compresses on the area of the rash or blisters as told by your doctor. This may help with pain and itching.  Keep your  rash covered with a loose bandage (dressing). Wear loose-fitting clothing.  Keep your rash and blisters clean with mild soap and cool water or as told by your doctor.  Check your rash every day for signs of infection. These include redness, swelling, and pain that lasts or gets worse.  Do not pick your blisters.  Do not scratch your rash. General instructions  Rest as told by your doctor.  Keep all follow-up visits as told by your doctor. This is important.  Until your blisters scab over, your infection can cause chickenpox in people who have never had it or been vaccinated against it. To prevent this from happening, avoid touching other people or being around other people, especially:  Babies.  Pregnant women.  Children who have eczema.  Elderly people who have transplants.  People who have chronic illnesses, such as leukemia or AIDS. Contact a doctor if:  Your pain does not get better with medicine.  Your pain does not get better after the rash  heals.  Your rash looks infected. Signs of infection include:  Redness.  Swelling.  Pain that lasts or gets worse. Get help right away if:  The rash is on your face or nose.  You have pain in your face, pain around your eye area, or loss of feeling on one side of your face.  You have ear pain or you have ringing in your ear.  You have loss of taste.  Your condition gets worse. This information is not intended to replace advice given to you by your health care provider. Make sure you discuss any questions you have with your health care provider. Document Released: 04/11/2008 Document Revised: 06/19/2016 Document Reviewed: 08/05/2014 Elsevier Interactive Patient Education  2017 [place] Spider bites are not common. Most spider bites do not cause serious problems. There are only a few types of spider bites that can cause serious health problems. Follow these instructions at home: Medicine  Take or  apply over-the-counter and prescription medicines only as told by your doctor.  If you were given an antibiotic medicine, take or apply it as told by your doctor. Do not stop using the antibiotic even if your condition improves. General instructions  Do not scratch the bite area.  Keep the bite area clean and dry. Wash the bite area with soap and water every day as told by your doctor.  If directed, apply ice to the bite area.  Put ice in a plastic bag.  Place a towel between your skin and the bag.  Leave the ice on for 20 minutes, 2-3 times per day.  Raise (elevate) the affected area above the level of your heart while you are sitting or lying down, if this is possible.  Keep all follow-up visits as told by your doctor. This is important. Contact a doctor if:  Your bite does not get better after 3 days.  Your bite turns black or purple.  Near the bite, you have:  Redness.  Swelling (inflammation).  Pain that is getting worse. Get help right away if:  You get shortness of breath or chest pain.  You have fluid, blood, or pus coming from the bite area.  You have muscle cramps or painful muscle spasms.  You have stomach (abdominal) pain.  You feel sick to your stomach (nauseous) or you throw up (vomit).  You feel more tired or sleepy than you normally do. This information is not intended to replace advice given to you by your health care provider. Make sure you discuss any questions you have with your health care provider. Document Released: 11/26/2010 Document Revised: 06/20/2016 Document Reviewed: 03/11/2015 Elsevier Interactive Patient Education  2017 [employer].

## 2017-01-01 DIAGNOSIS — G4733 Obstructive sleep apnea (adult) (pediatric): Secondary | ICD-10-CM | POA: Diagnosis not present

## 2017-01-29 DIAGNOSIS — G4733 Obstructive sleep apnea (adult) (pediatric): Secondary | ICD-10-CM | POA: Diagnosis not present

## 2017-03-01 DIAGNOSIS — G4733 Obstructive sleep apnea (adult) (pediatric): Secondary | ICD-10-CM | POA: Diagnosis not present

## 2017-03-10 ENCOUNTER — Ambulatory Visit (INDEPENDENT_AMBULATORY_CARE_PROVIDER_SITE_OTHER): Payer: BLUE CROSS/BLUE SHIELD

## 2017-03-10 ENCOUNTER — Ambulatory Visit (INDEPENDENT_AMBULATORY_CARE_PROVIDER_SITE_OTHER): Payer: BLUE CROSS/BLUE SHIELD | Admitting: Family Medicine

## 2017-03-10 VITALS — BP 125/75 | HR 82 | Temp 98.6°F | Resp 16 | Ht 60.0 in | Wt 136.0 lb

## 2017-03-10 DIAGNOSIS — R05 Cough: Secondary | ICD-10-CM

## 2017-03-10 DIAGNOSIS — N3091 Cystitis, unspecified with hematuria: Secondary | ICD-10-CM

## 2017-03-10 DIAGNOSIS — R059 Cough, unspecified: Secondary | ICD-10-CM

## 2017-03-10 DIAGNOSIS — R350 Frequency of micturition: Secondary | ICD-10-CM | POA: Diagnosis not present

## 2017-03-10 DIAGNOSIS — R3129 Other microscopic hematuria: Secondary | ICD-10-CM | POA: Diagnosis not present

## 2017-03-10 DIAGNOSIS — R519 Headache, unspecified: Secondary | ICD-10-CM

## 2017-03-10 DIAGNOSIS — R51 Headache: Secondary | ICD-10-CM | POA: Diagnosis not present

## 2017-03-10 DIAGNOSIS — R509 Fever, unspecified: Secondary | ICD-10-CM

## 2017-03-10 LAB — POCT CBC
Granulocyte percent: 68.8 %G (ref 37–80)
HEMATOCRIT: 33.4 % — AB (ref 37.7–47.9)
HEMOGLOBIN: 11.5 g/dL — AB (ref 12.2–16.2)
Lymph, poc: 1.5 (ref 0.6–3.4)
MCH: 29.9 pg (ref 27–31.2)
MCHC: 34.5 g/dL (ref 31.8–35.4)
MCV: 86.5 fL (ref 80–97)
MID (CBC): 0.8 (ref 0–0.9)
MPV: 7.6 fL (ref 0–99.8)
POC GRANULOCYTE: 5.2 (ref 2–6.9)
POC LYMPH PERCENT: 20.6 %L (ref 10–50)
POC MID %: 10.6 % (ref 0–12)
Platelet Count, POC: 230 10*3/uL (ref 142–424)
RBC: 3.86 M/uL — AB (ref 4.04–5.48)
RDW, POC: 13.1 %
WBC: 7.5 10*3/uL (ref 4.6–10.2)

## 2017-03-10 LAB — POCT URINALYSIS DIP (MANUAL ENTRY)
BILIRUBIN UA: NEGATIVE mg/dL
Bilirubin, UA: NEGATIVE
Glucose, UA: NEGATIVE mg/dL
LEUKOCYTES UA: NEGATIVE
Nitrite, UA: NEGATIVE
Urobilinogen, UA: 1 E.U./dL
pH, UA: 5 (ref 5.0–8.0)

## 2017-03-10 LAB — POC INFLUENZA A&B (BINAX/QUICKVUE)
Influenza A, POC: NEGATIVE
Influenza B, POC: NEGATIVE

## 2017-03-10 LAB — POC MICROSCOPIC URINALYSIS (UMFC): MUCUS RE: ABSENT

## 2017-03-10 MED ORDER — CIPROFLOXACIN HCL 500 MG PO TABS: 500.0000 mg | ORAL_TABLET | Freq: Two times a day (BID) | ORAL | 0 refills | Status: DC

## 2017-03-10 NOTE — Patient Instructions (Addendum)
Overall your testing was reassuring. Hemoglobin was borderline low, but can recheck this at future visit.  With blood in the urine, a hemorrhagic cystitis or urinary tract infection is possible. I will check a urine culture, but for now start Cipro to cover for infection. Recheck in 2 weeks for repeat urine test at that time. If still having blood in the urine at that point, it may be worth an evaluation with urology.  If fevers, other symptoms are not improving in the next 48-72 hours, return for recheck. Sooner or to emergency room if worse.   Urinary Tract Infection, Adult A urinary tract infection (UTI) is an infection of any part of the urinary tract, which includes the kidneys, ureters, bladder, and urethra. These organs make, store, and get rid of urine in the body. UTI can be a bladder infection (cystitis) or kidney infection (pyelonephritis). What are the causes? This infection may be caused by fungi, viruses, or bacteria. Bacteria are the most common cause of UTIs. This condition can also be caused by repeated incomplete emptying of the bladder during urination. What increases the risk? This condition is more likely to develop if:  You ignore your need to urinate or hold urine for long periods of time.  You do not empty your bladder completely during urination.  You wipe back to front after urinating or having a bowel movement, if you are female.  You are uncircumcised, if you are female.  You are constipated.  You have a urinary catheter that stays in place (indwelling).  You have a weak defense (immune) system.  You have a medical condition that affects your bowels, kidneys, or bladder.  You have diabetes.  You take antibiotic medicines frequently or for long periods of time, and the antibiotics no longer work well against certain types of infections (antibiotic resistance).  You take medicines that irritate your urinary tract.  You are exposed to chemicals that irritate  your urinary tract.  You are female. What are the signs or symptoms? Symptoms of this condition include:  Fever.  Frequent urination or passing small amounts of urine frequently.  Needing to urinate urgently.  Pain or burning with urination.  Urine that smells bad or unusual.  Cloudy urine.  Pain in the lower abdomen or back.  Trouble urinating.  Blood in the urine.  Vomiting or being less hungry than normal.  Diarrhea or abdominal pain.  Vaginal discharge, if you are female. How is this diagnosed? This condition is diagnosed with a medical history and physical exam. You will also need to provide a urine sample to test your urine. Other tests may be done, including:  Blood tests.  Sexually transmitted disease (STD) testing. If you have had more than one UTI, a cystoscopy or imaging studies may be done to determine the cause of the infections. How is this treated? Treatment for this condition often includes a combination of two or more of the following:  Antibiotic medicine.  Other medicines to treat less common causes of UTI.  Over-the-counter medicines to treat pain.  Drinking enough water to stay hydrated. Follow these instructions at home:  Take over-the-counter and prescription medicines only as told by your health care provider.  If you were prescribed an antibiotic, take it as told by your health care provider. Do not stop taking the antibiotic even if you start to feel better.  Avoid alcohol, caffeine, tea, and carbonated beverages. They can irritate your bladder.  Drink enough fluid to keep your urine  clear or pale yellow.  Keep all follow-up visits as told by your health care provider. This is important.  Make sure to:  Empty your bladder often and completely. Do not hold urine for long periods of time.  Empty your bladder before and after sex.  Wipe from front to back after a bowel movement if you are female. Use each tissue one time when you  wipe. Contact a health care provider if:  You have back pain.  You have a fever.  You feel nauseous or vomit.  Your symptoms do not get better after 3 days.  Your symptoms go away and then return. Get help right away if:  You have severe back pain or lower abdominal pain.  You are vomiting and cannot keep down any medicines or water. This information is not intended to replace advice given to you by your health care provider. Make sure you discuss any questions you have with your health care provider. Document Released: 08/03/2005 Document Revised: 04/06/2016 Document Reviewed: 09/14/2015 Elsevier Interactive Patient Education  2017 [employer]  Hematuria, Adult Hematuria is blood in your urine. It can be caused by a bladder infection, kidney infection, prostate infection, kidney stone, or cancer of your urinary tract. Infections can usually be treated with medicine, and a kidney stone usually will pass through your urine. If neither of these is the cause of your hematuria, further workup to find out the reason may be needed. It is very important that you tell your health care provider about any blood you see in your urine, even if the blood stops without treatment or happens without causing pain. Blood in your urine that happens and then stops and then happens again can be a symptom of a very serious condition. Also, pain is not a symptom in the initial stages of many urinary cancers. Follow these instructions at home:  Drink lots of fluid, 3-4 quarts a day. If you have been diagnosed with an infection, cranberry juice is especially recommended, in addition to large amounts of water.  Avoid caffeine, tea, and carbonated beverages because they tend to irritate the bladder.  Avoid alcohol because it may irritate the prostate.  Take all medicines as directed by your health care provider.  If you were prescribed an antibiotic medicine, finish it all even if you start to feel  better.  If you have been diagnosed with a kidney stone, follow your health care provider's instructions regarding straining your urine to catch the stone.  Empty your bladder often. Avoid holding urine for long periods of time.  After a bowel movement, women should cleanse front to back. Use each tissue only once.  Empty your bladder before and after sexual intercourse if you are a female. Contact a health care provider if:  You develop back pain.  You have a fever.  You have a feeling of sickness in your stomach (nausea) or vomiting.  Your symptoms are not better in 3 days. Return sooner if you are getting worse. Get help right away if:  You develop severe vomiting and are unable to keep the medicine down.  You develop severe back or abdominal pain despite taking your medicines.  You begin passing a large amount of blood or clots in your urine.  You feel extremely weak or faint, or you pass out. This information is not intended to replace advice given to you by your health care provider. Make sure you discuss any questions you have with your health care provider. Document  Released: 10/24/2005 Document Revised: 03/31/2016 Document Reviewed: 06/24/2013 Elsevier Interactive Patient Education  2017 [employer].   IF you received an x-ray today, you will receive an invoice from [facility]. Please contact [facility] at [phone] with questions or concerns regarding your invoice.   IF you received labwork today, you will receive an invoice from [facility]. Please contact [facility] at [phone] with questions or concerns regarding your invoice.   Our billing staff will not be able to assist you with questions regarding bills from these companies.  You will be contacted with the lab results as soon as they are available. The fastest way to get your results is to activate your My Chart account. Instructions are located on the last page of this paperwork. If you  have not heard from [place] regarding the results in 2 weeks, please contact this office.

## 2017-03-10 NOTE — Progress Notes (Signed)
Subjective:  By signing my name below, I, [name], attest that this documentation has been prepared under the direction and in the presence of [name], MD Electronically Signed: Ladene Artist, ED Scribe 03/10/2017 at 4:07 PM.   Patient ID: Sarah Beasley, female    DOB: [birth year], 56 y.o.   MRN: [id number]  Chief Complaint  Patient presents with  . Headache    Pt states symptoms began Sunday evening   . Generalized Body Aches  . Nausea  . Fever   HPI Sarah Beasley is a 56 y.o. female who presents to Primary Care at [facility] complaining of fever with Tmax of 102.7 F onset 4 days ago. Triage temperature 98.6 F. Pt reports associated symptoms of chills, generalized myalgias which have resolved, nausea, 1 episode of emesis 2 days ago, HAs at night and in the morning, neck pain, episodic cough, soreness in chest and back only with coughing, urinary frequency onset today. She has tried Advil without significant relief. Pt denies neck stiffness, hematuria, dysuria, abdominal pain, rash, sore throat, congestion, rhinorrhea. She does report a h/o darker urine until increasing water intake. Pt states that a coworker has influenza. She has not received a flu vaccine this season. Pt denies recent foreign travel.   There are no active problems to display for this patient.  Past Medical History:  Diagnosis Date  . Heart murmur   . Hypertension    Past Surgical History:  Procedure Laterality Date  . KNEE SURGERY Left    No Known Allergies Prior to Admission medications   Medication Sig Start Date End Date Taking? Authorizing Provider  doxylamine, Sleep, (UNISOM) 25 MG tablet Take 25 mg by mouth at bedtime as needed.    Historical Provider, MD  meloxicam (MOBIC) 15 MG tablet Take 1 tablet (15 mg total) by mouth daily. 12/14/16   Scot Jun, FNP  Multiple Vitamin (MULTIVITAMIN) tablet Take 1 tablet by mouth daily.    Historical Provider, MD   Social History   Social  History  . Marital status: Single    Spouse name: N/A  . Number of children: 0  . Years of education: BA   Occupational History  . Not on file.   Social History Main Topics  . Smoking status: Never Smoker  . Smokeless tobacco: Not on file  . Alcohol use 3.0 oz/week    5 Standard drinks or equivalent per week  . Drug use: No  . Sexual activity: Not on file   Other Topics Concern  . Not on file   Social History Narrative   Exercise walking for 1 hour   Drinks 1 cup of coffee in the morning.    Review of Systems  Constitutional: Positive for chills and fever.  HENT: Negative for congestion, rhinorrhea and sore throat.   Respiratory: Positive for cough (occasional).   Gastrointestinal: Positive for nausea and vomiting. Negative for abdominal pain.  Genitourinary: Positive for frequency. Negative for dysuria and hematuria.  Musculoskeletal: Positive for myalgias and neck pain. Negative for neck stiffness.  Skin: Negative for rash.  Neurological: Positive for headaches.      Objective:   Physical Exam  Constitutional: She is oriented to person, place, and time. She appears well-developed and well-nourished. No distress.  HENT:  Head: Normocephalic and atraumatic.  Right Ear: Hearing, tympanic membrane, external ear and ear canal normal.  Left Ear: Hearing, tympanic membrane, external ear and ear canal normal.  Nose: Nose normal.  Mouth/Throat: Oropharynx  is clear and moist and mucous membranes are normal. No oropharyngeal exudate or posterior oropharyngeal erythema.  TMs pearly grey.   Eyes: Conjunctivae and EOM are normal. Pupils are equal, round, and reactive to light.  Neck: Neck supple.  Neck is nontender.  Cardiovascular: Normal rate, regular rhythm, normal heart sounds and intact distal pulses.   No murmur heard. Pulmonary/Chest: Effort normal and breath sounds normal. No respiratory distress. She has no wheezes. She has no rhonchi.  Possible few coarse breath  sounds R lower lobe.   Abdominal: Soft. Bowel sounds are normal. She exhibits no distension. There is no tenderness. There is no CVA tenderness.  Neurological: She is alert and oriented to person, place, and time.  Skin: Skin is warm and dry. No rash noted.  Normal turgor.   Psychiatric: She has a normal mood and affect. Her behavior is normal.  Vitals reviewed.  Vitals:   03/10/17 1535  BP: 125/75  Pulse: 82  Resp: 16  Temp: 98.6 F (37 C)  TempSrc: Oral  SpO2: 97%  Weight: 136 lb (61.7 kg)  Height: 5' (1.524 m)   Results for orders placed or performed in visit on 03/10/17  POC Influenza A&B(BINAX/QUICKVUE)  Result Value Ref Range   Influenza A, POC Negative Negative   Influenza B, POC Negative Negative      Assessment & Plan:    Sarah Beasley is a 56 y.o. female Fever, unspecified - Plan: POCT CBC, POC Influenza A&B(BINAX/QUICKVUE), DG Chest 2 View  Urinary frequency - Plan: POCT urinalysis dipstick, POCT Microscopic Urinalysis ([facility])  Cough - Plan: POC Influenza A&B(BINAX/QUICKVUE), DG Chest 2 View  Nonintractable episodic headache, unspecified headache type  Hemorrhagic cystitis - Plan: ciprofloxacin (CIPRO) 500 MG tablet, Urine culture, Basic metabolic panel  Other microscopic hematuria - Plan: ciprofloxacin (CIPRO) 500 MG tablet, Urine culture, Basic metabolic panel   Hematuria, with reported prior dark urine, may have been hematuria at that time. With current symptoms of urgency/frequency, possible hemorrhagic cystitis, although do not see significant WBC on urinalysis.   - Will check urine culture, cover with Cipro as recent nausea vomiting and fever concerning for ascending infection.  -Fever care discussed, fluids, relative rest, and RTC/ER precautions given.  -Hematuria recheck in 2 weeks, if persistent at that time, consider urology evaluation.   - check BMP to evaluate renal function.  Meds ordered this encounter  Medications  . ciprofloxacin (CIPRO)  500 MG tablet    Sig: Take 1 tablet (500 mg total) by mouth 2 (two) times daily.    Dispense:  20 tablet    Refill:  0   Patient Instructions   Overall your testing was reassuring. Hemoglobin was borderline low, but can recheck this at future visit.  With blood in the urine, a hemorrhagic cystitis or urinary tract infection is possible. I will check a urine culture, but for now start Cipro to cover for infection. Recheck in 2 weeks for repeat urine test at that time. If still having blood in the urine at that point, it may be worth an evaluation with urology.  If fevers, other symptoms are not improving in the next 48-72 hours, return for recheck. Sooner or to emergency room if worse.   Urinary Tract Infection, Adult A urinary tract infection (UTI) is an infection of any part of the urinary tract, which includes the kidneys, ureters, bladder, and urethra. These organs make, store, and get rid of urine in the body. UTI can be a bladder infection (cystitis)  or kidney infection (pyelonephritis). What are the causes? This infection may be caused by fungi, viruses, or bacteria. Bacteria are the most common cause of UTIs. This condition can also be caused by repeated incomplete emptying of the bladder during urination. What increases the risk? This condition is more likely to develop if:  You ignore your need to urinate or hold urine for long periods of time.  You do not empty your bladder completely during urination.  You wipe back to front after urinating or having a bowel movement, if you are female.  You are uncircumcised, if you are female.  You are constipated.  You have a urinary catheter that stays in place (indwelling).  You have a weak defense (immune) system.  You have a medical condition that affects your bowels, kidneys, or bladder.  You have diabetes.  You take antibiotic medicines frequently or for long periods of time, and the antibiotics no longer work well against  certain types of infections (antibiotic resistance).  You take medicines that irritate your urinary tract.  You are exposed to chemicals that irritate your urinary tract.  You are female. What are the signs or symptoms? Symptoms of this condition include:  Fever.  Frequent urination or passing small amounts of urine frequently.  Needing to urinate urgently.  Pain or burning with urination.  Urine that smells bad or unusual.  Cloudy urine.  Pain in the lower abdomen or back.  Trouble urinating.  Blood in the urine.  Vomiting or being less hungry than normal.  Diarrhea or abdominal pain.  Vaginal discharge, if you are female. How is this diagnosed? This condition is diagnosed with a medical history and physical exam. You will also need to provide a urine sample to test your urine. Other tests may be done, including:  Blood tests.  Sexually transmitted disease (STD) testing. If you have had more than one UTI, a cystoscopy or imaging studies may be done to determine the cause of the infections. How is this treated? Treatment for this condition often includes a combination of two or more of the following:  Antibiotic medicine.  Other medicines to treat less common causes of UTI.  Over-the-counter medicines to treat pain.  Drinking enough water to stay hydrated. Follow these instructions at home:  Take over-the-counter and prescription medicines only as told by your health care provider.  If you were prescribed an antibiotic, take it as told by your health care provider. Do not stop taking the antibiotic even if you start to feel better.  Avoid alcohol, caffeine, tea, and carbonated beverages. They can irritate your bladder.  Drink enough fluid to keep your urine clear or pale yellow.  Keep all follow-up visits as told by your health care provider. This is important.  Make sure to:  Empty your bladder often and completely. Do not hold urine for long periods  of time.  Empty your bladder before and after sex.  Wipe from front to back after a bowel movement if you are female. Use each tissue one time when you wipe. Contact a health care provider if:  You have back pain.  You have a fever.  You feel nauseous or vomit.  Your symptoms do not get better after 3 days.  Your symptoms go away and then return. Get help right away if:  You have severe back pain or lower abdominal pain.  You are vomiting and cannot keep down any medicines or water. This information is not intended to replace advice given  to you by your health care provider. Make sure you discuss any questions you have with your health care provider. Document Released: 08/03/2005 Document Revised: 04/06/2016 Document Reviewed: 09/14/2015 Elsevier Interactive Patient Education  2017 [employer]  Hematuria, Adult Hematuria is blood in your urine. It can be caused by a bladder infection, kidney infection, prostate infection, kidney stone, or cancer of your urinary tract. Infections can usually be treated with medicine, and a kidney stone usually will pass through your urine. If neither of these is the cause of your hematuria, further workup to find out the reason may be needed. It is very important that you tell your health care provider about any blood you see in your urine, even if the blood stops without treatment or happens without causing pain. Blood in your urine that happens and then stops and then happens again can be a symptom of a very serious condition. Also, pain is not a symptom in the initial stages of many urinary cancers. Follow these instructions at home:  Drink lots of fluid, 3-4 quarts a day. If you have been diagnosed with an infection, cranberry juice is especially recommended, in addition to large amounts of water.  Avoid caffeine, tea, and carbonated beverages because they tend to irritate the bladder.  Avoid alcohol because it may irritate the  prostate.  Take all medicines as directed by your health care provider.  If you were prescribed an antibiotic medicine, finish it all even if you start to feel better.  If you have been diagnosed with a kidney stone, follow your health care provider's instructions regarding straining your urine to catch the stone.  Empty your bladder often. Avoid holding urine for long periods of time.  After a bowel movement, women should cleanse front to back. Use each tissue only once.  Empty your bladder before and after sexual intercourse if you are a female. Contact a health care provider if:  You develop back pain.  You have a fever.  You have a feeling of sickness in your stomach (nausea) or vomiting.  Your symptoms are not better in 3 days. Return sooner if you are getting worse. Get help right away if:  You develop severe vomiting and are unable to keep the medicine down.  You develop severe back or abdominal pain despite taking your medicines.  You begin passing a large amount of blood or clots in your urine.  You feel extremely weak or faint, or you pass out. This information is not intended to replace advice given to you by your health care provider. Make sure you discuss any questions you have with your health care provider. Document Released: 10/24/2005 Document Revised: 03/31/2016 Document Reviewed: 06/24/2013 Elsevier Interactive Patient Education  2017 [employer].   IF you received an x-ray today, you will receive an invoice from [facility]. Please contact [facility] at [phone] with questions or concerns regarding your invoice.   IF you received labwork today, you will receive an invoice from [place]. Please contact [facility] at [phone] with questions or concerns regarding your invoice.   Our billing staff will not be able to assist you with questions regarding bills from these companies.  You will be contacted with the lab results as soon  as they are available. The fastest way to get your results is to activate your My Chart account. Instructions are located on the last page of this paperwork. If you have not heard from [place] regarding the results in 2 weeks, please contact this  office.       I personally performed the services described in this documentation, which was scribed in my presence. The recorded information has been reviewed and considered for accuracy and completeness, addended by me as needed, and agree with information above.  Signed,   Merri Ray, MD [facility] [place].  03/10/17 5:20 PM

## 2017-03-11 LAB — BASIC METABOLIC PANEL
BUN / CREAT RATIO: 17 (ref 9–23)
BUN: 10 mg/dL (ref 6–24)
CO2: 24 mmol/L (ref 18–29)
CREATININE: 0.6 mg/dL (ref 0.57–1.00)
Calcium: 9.1 mg/dL (ref 8.7–10.2)
Chloride: 97 mmol/L (ref 96–106)
GFR calc Af Amer: 119 mL/min/{1.73_m2} (ref >59)
GFR, EST NON AFRICAN AMERICAN: 103 mL/min/{1.73_m2} (ref >59)
GLUCOSE: 92 mg/dL (ref 65–99)
Potassium: 4.6 mmol/L (ref 3.5–5.2)
Sodium: 137 mmol/L (ref 134–144)

## 2017-03-11 LAB — URINE CULTURE

## 2017-03-30 ENCOUNTER — Encounter: Payer: Self-pay | Admitting: Family Medicine

## 2017-03-30 ENCOUNTER — Ambulatory Visit (INDEPENDENT_AMBULATORY_CARE_PROVIDER_SITE_OTHER): Payer: BLUE CROSS/BLUE SHIELD | Admitting: Family Medicine

## 2017-03-30 VITALS — BP 159/93 | HR 67 | Temp 97.8°F | Resp 16 | Wt 134.2 lb

## 2017-03-30 DIAGNOSIS — R3129 Other microscopic hematuria: Secondary | ICD-10-CM | POA: Diagnosis not present

## 2017-03-30 DIAGNOSIS — D649 Anemia, unspecified: Secondary | ICD-10-CM

## 2017-03-30 DIAGNOSIS — J111 Influenza due to unidentified influenza virus with other respiratory manifestations: Secondary | ICD-10-CM

## 2017-03-30 DIAGNOSIS — R69 Illness, unspecified: Secondary | ICD-10-CM

## 2017-03-30 NOTE — Progress Notes (Addendum)
Subjective:  By signing my name below, I, [name], attest that this documentation has been prepared under the direction and in the presence of Merri Ray, MD. Electronically Signed: [name], [place]. 03/30/2017 , 5:13 PM .  Patient was seen in Room 26 .   Patient ID: Sarah Beasley, female    DOB: 10-Mar-1961, 56 y.o.   MRN: [id number] Chief Complaint  Patient presents with  . Follow-up   HPI Sarah Beasley is a 56 y.o. female Here for follow up. She was seen on May 4th with fever and hematuria; possible hemorrhagic cystitis. She was also having fever and cough, possible flu-like illness at that time. Her urinalysis was notable for blood without significant WBC. She was initially treated with Cipro, but advised to stop after urine culture indicated mixed urogenital flora only. Her renal function was normal with creatinine 0.60, normal BMP and CBC overall normal except borderline hemoglobin 11.5 at May 4th visit. Her flu testing was negative, and chest xray was also negative for acute disease.   She reports feeling worse the day after May 4th visit with headache, imbalance, fatigue and vomiting. After 2 days starting Cipro, she had some diarrhea, but that has resolved now. She took about 5 days worth of Cipro before she was informed to stop. She states checking her resting heart rate with her FitBit, and it was running around 70s. With any movement even just walking, her heart rate would shoot up. This has also resolved. Her urine had a darker color with frequency, but she states this was back to normal. She denies vaginal bleeding, tick exposure, or prior camping. She denies appetite loss or fatigue now.   There are no active problems to display for this patient.  Past Medical History:  Diagnosis Date  . Heart murmur   . Hypertension    Past Surgical History:  Procedure Laterality Date  . KNEE SURGERY Left    No Known Allergies Prior to Admission medications     Medication Sig Start Date End Date Taking? Authorizing Provider  ciprofloxacin (CIPRO) 500 MG tablet Take 1 tablet (500 mg total) by mouth 2 (two) times daily. 03/10/17   [name], MD  doxylamine, Sleep, (UNISOM) 25 MG tablet Take 25 mg by mouth at bedtime as needed.    [provider]  meloxicam (MOBIC) 15 MG tablet Take 1 tablet (15 mg total) by mouth daily. Patient not taking: Reported on 03/10/2017 12/14/16   Scot Jun, FNP  Multiple Vitamin (MULTIVITAMIN) tablet Take 1 tablet by mouth daily.    [provider]   Social History   Social History  . Marital status: Single    Spouse name: N/A  . Number of children: 0  . Years of education: BA   Occupational History  . Not on file.   Social History Main Topics  . Smoking status: Never Smoker  . Smokeless tobacco: Never Used  . Alcohol use 3.0 oz/week    5 Standard drinks or equivalent per week  . Drug use: No  . Sexual activity: Not on file   Other Topics Concern  . Not on file   Social History Narrative   Exercise walking for 1 hour   Drinks 1 cup of coffee in the morning.    Review of Systems  Constitutional: Negative for appetite change, chills, fatigue, fever and unexpected weight change.  Respiratory: Negative for cough.   Gastrointestinal: Negative for constipation, diarrhea, nausea and vomiting.  Genitourinary:  Negative for frequency, hematuria, menstrual problem and vaginal bleeding.  Skin: Negative for rash and wound.  Neurological: Negative for dizziness, weakness and headaches.       Objective:   Physical Exam  Constitutional: She is oriented to person, place, and time. She appears well-developed and well-nourished. No distress.  HENT:  Head: Normocephalic and atraumatic.  Eyes: EOM are normal. Pupils are equal, round, and reactive to light.  Neck: Neck supple.  Cardiovascular: Normal rate.   Pulmonary/Chest: Effort normal. No respiratory distress.  Abdominal: Soft. Bowel  sounds are normal. She exhibits no distension. There is no tenderness. There is no rebound, no guarding and no CVA tenderness.  Musculoskeletal: Normal range of motion.  Neurological: She is alert and oriented to person, place, and time.  Skin: Skin is warm and dry.  Psychiatric: She has a normal mood and affect. Her behavior is normal.  Nursing note and vitals reviewed.   Vitals:   03/30/17 1644  BP: (!) 159/93  Pulse: 67  Resp: 16  Temp: 97.8 F (36.6 C)  TempSrc: Oral  SpO2: 96%  Weight: 134 lb 3.2 oz (60.9 kg)   Results for orders placed or performed in visit on 03/30/17  CBC  Result Value Ref Range   WBC 8.9 3.4 - 10.8 x10E3/uL   RBC 4.49 3.77 - 5.28 x10E6/uL   Hemoglobin 12.9 11.1 - 15.9 g/dL   Hematocrit 39.3 34.0 - 46.6 %   MCV 88 79 - 97 fL   MCH 28.7 26.6 - 33.0 pg   MCHC 32.8 31.5 - 35.7 g/dL   RDW 14.4 12.3 - 15.4 %   Platelets 326 150 - 379 x10E3/uL  Urine Microscopic  Result Value Ref Range   WBC, UA None seen 0 - 5 /hpf   RBC, UA 0-2 0 - 2 /hpf   Epithelial Cells (non renal) 0-10 0 - 10 /hpf   Casts None seen None seen /lpf   Mucus, UA Present Not Estab.   Bacteria, UA None seen None seen/Few       Assessment & Plan:   Sarah Beasley is a 56 y.o. female Other microscopic hematuria - Plan: POCT urinalysis dipstick, Urine Microscopic  - Persistent microscopic hematuria. We'll refer to hematology for further evaluation as previous evaluation for infection was negative.  Anemia, unspecified type - Plan: CBC  - Now resolved, may have been temporarily low with prior acute illness. Now back to baseline.  Influenza-like illness  - Possible influenza versus other viral illness, now resolved. If return of fever or other previous symptoms, can look into other causes.  No orders of the defined types were placed in this encounter.  Patient Instructions   I will repeat your urine test today. If blood still persists, may refer you to urology to look into  other possible testing. I will also recheck your blood count as last hemoglobin was borderline low.  Looking back, it appears you may have had a viral illness such as influenza at last visit. As those symptoms have resolved, I do not think any other testing is needed today for those particular symptoms. If you do have fevers, difficulty with urinating, or other return of similar symptoms, return for recheck.  Let me know if you have questions in the meantime      IF you received an x-ray today, you will receive an invoice from [facility]. Please contact [facility] at [phone] with questions or concerns regarding your invoice.   IF you received  labwork today, you will receive an invoice from [facility]. Please contact [facility] at [phone] with questions or concerns regarding your invoice.   Our billing staff will not be able to assist you with questions regarding bills from these companies.  You will be contacted with the lab results as soon as they are available. The fastest way to get your results is to activate your My Chart account. Instructions are located on the last page of this paperwork. If you have not heard from [place] regarding the results in 2 weeks, please contact this office.       I personally performed the services described in this documentation, which was scribed in my presence. The recorded information has been reviewed and considered for accuracy and completeness, addended by me as needed, and agree with information above.  Signed,   Merri Ray, MD [facility].  03/30/17 6:46 PM

## 2017-03-30 NOTE — Patient Instructions (Signed)
I will repeat your urine test today. If blood still persists, may refer you to urology to look into other possible testing. I will also recheck your blood count as last hemoglobin was borderline low.  Looking back, it appears you may have had a viral illness such as influenza at last visit. As those symptoms have resolved, I do not think any other testing is needed today for those particular symptoms. If you do have fevers, difficulty with urinating, or other return of similar symptoms, return for recheck.  Let me know if you have questions in the meantime      IF you received an x-ray today, you will receive an invoice from [facility]. Please contact [facility] at [phone] with questions or concerns regarding your invoice.   IF you received labwork today, you will receive an invoice from [facility]. Please contact [facility] at [phone] with questions or concerns regarding your invoice.   Our billing staff will not be able to assist you with questions regarding bills from these companies.  You will be contacted with the lab results as soon as they are available. The fastest way to get your results is to activate your My Chart account. Instructions are located on the last page of this paperwork. If you have not heard from [place] regarding the results in 2 weeks, please contact this office.

## 2017-03-31 DIAGNOSIS — G4733 Obstructive sleep apnea (adult) (pediatric): Secondary | ICD-10-CM | POA: Diagnosis not present

## 2017-03-31 LAB — CBC
Hematocrit: 39.3 % (ref 34.0–46.6)
Hemoglobin: 12.9 g/dL (ref 11.1–15.9)
MCH: 28.7 pg (ref 26.6–33.0)
MCHC: 32.8 g/dL (ref 31.5–35.7)
MCV: 88 fL (ref 79–97)
PLATELETS: 326 10*3/uL (ref 150–379)
RBC: 4.49 x10E6/uL (ref 3.77–5.28)
RDW: 14.4 % (ref 12.3–15.4)
WBC: 8.9 10*3/uL (ref 3.4–10.8)

## 2017-03-31 LAB — URINALYSIS, MICROSCOPIC ONLY
BACTERIA UA: NONE SEEN
CASTS: NONE SEEN /LPF
WBC, UA: NONE SEEN /hpf (ref 0–?)

## 2017-06-22 DIAGNOSIS — R3121 Asymptomatic microscopic hematuria: Secondary | ICD-10-CM | POA: Diagnosis not present

## 2017-06-30 ENCOUNTER — Encounter: Payer: Self-pay | Admitting: Family Medicine

## 2017-06-30 DIAGNOSIS — R3121 Asymptomatic microscopic hematuria: Secondary | ICD-10-CM | POA: Diagnosis not present

## 2017-07-07 ENCOUNTER — Encounter: Payer: Self-pay | Admitting: Family Medicine

## 2017-07-07 DIAGNOSIS — R319 Hematuria, unspecified: Secondary | ICD-10-CM | POA: Insufficient documentation

## 2017-07-27 DIAGNOSIS — R3121 Asymptomatic microscopic hematuria: Secondary | ICD-10-CM | POA: Diagnosis not present

## 2017-07-27 DIAGNOSIS — D734 Cyst of spleen: Secondary | ICD-10-CM | POA: Diagnosis not present

## 2017-12-04 ENCOUNTER — Other Ambulatory Visit: Payer: Self-pay | Admitting: Urology

## 2017-12-04 DIAGNOSIS — D734 Cyst of spleen: Secondary | ICD-10-CM

## 2017-12-20 ENCOUNTER — Ambulatory Visit (INDEPENDENT_AMBULATORY_CARE_PROVIDER_SITE_OTHER): Payer: BLUE CROSS/BLUE SHIELD

## 2017-12-20 ENCOUNTER — Encounter: Payer: Self-pay | Admitting: Physician Assistant

## 2017-12-20 ENCOUNTER — Ambulatory Visit (INDEPENDENT_AMBULATORY_CARE_PROVIDER_SITE_OTHER): Payer: BLUE CROSS/BLUE SHIELD | Admitting: Physician Assistant

## 2017-12-20 VITALS — BP 118/68 | HR 100 | Temp 99.6°F | Resp 18 | Ht 60.0 in | Wt 136.0 lb

## 2017-12-20 DIAGNOSIS — R0602 Shortness of breath: Secondary | ICD-10-CM

## 2017-12-20 DIAGNOSIS — R059 Cough, unspecified: Secondary | ICD-10-CM

## 2017-12-20 DIAGNOSIS — R05 Cough: Secondary | ICD-10-CM

## 2017-12-20 DIAGNOSIS — R6889 Other general symptoms and signs: Secondary | ICD-10-CM | POA: Diagnosis not present

## 2017-12-20 LAB — POCT INFLUENZA A/B
INFLUENZA A, POC: NEGATIVE
Influenza B, POC: NEGATIVE

## 2017-12-20 MED ORDER — BENZONATATE 100 MG PO CAPS: 100.0000 mg | ORAL_CAPSULE | Freq: Three times a day (TID) | ORAL | 0 refills | Status: DC | PRN

## 2017-12-20 MED ORDER — AZELASTINE HCL 0.1 % NA SOLN: 2.0000 | Freq: Two times a day (BID) | NASAL | 0 refills | Status: DC

## 2017-12-20 MED ORDER — GUAIFENESIN ER 1200 MG PO TB12: 1.0000 | ORAL_TABLET | Freq: Two times a day (BID) | ORAL | 1 refills | Status: DC | PRN

## 2017-12-20 NOTE — Patient Instructions (Addendum)
   IF you received an x-ray today, you will receive an invoice from [facility]. Please contact  Radiology at [phone] with questions or concerns regarding your invoice.   IF you received labwork today, you will receive an invoice from [facility]. Please contact [facility] at [phone] with questions or concerns regarding your invoice.   Our billing staff will not be able to assist you with questions regarding bills from these companies.  You will be contacted with the lab results as soon as they are available. The fastest way to get your results is to activate your My Chart account. Instructions are located on the last page of this paperwork. If you have not heard from us regarding the results in 2 weeks, please contact this office.     Acute Bronchitis, Adult Acute bronchitis is sudden (acute) swelling of the air tubes (bronchi) in the lungs. Acute bronchitis causes these tubes to fill with mucus, which can make it hard to breathe. It can also cause coughing or wheezing. In adults, acute bronchitis usually goes away within 2 weeks. A cough caused by bronchitis may last up to 3 weeks. Smoking, allergies, and asthma can make the condition worse. Repeated episodes of bronchitis may cause further lung problems, such as chronic obstructive pulmonary disease (COPD). What are the causes? This condition can be caused by germs and by substances that irritate the lungs, including:  Cold and flu viruses. This condition is most often caused by the same virus that causes a cold.  Bacteria.  Exposure to tobacco smoke, dust, fumes, and air pollution.  What increases the risk? This condition is more likely to develop in people who:  Have close contact with someone with acute bronchitis.  Are exposed to lung irritants, such as tobacco smoke, dust, fumes, and vapors.  Have a weak immune system.  Have a respiratory condition such as asthma.  What are the signs or  symptoms? Symptoms of this condition include:  A cough.  Coughing up clear, yellow, or green mucus.  Wheezing.  Chest congestion.  Shortness of breath.  A fever.  Body aches.  Chills.  A sore throat.  How is this diagnosed? This condition is usually diagnosed with a physical exam. During the exam, your health care provider may order tests, such as chest X-rays, to rule out other conditions. He or she may also:  Test a sample of your mucus for bacterial infection.  Check the level of oxygen in your blood. This is done to check for pneumonia.  Do a chest X-ray or lung function testing to rule out pneumonia and other conditions.  Perform blood tests.  Your health care provider will also ask about your symptoms and medical history. How is this treated? Most cases of acute bronchitis clear up over time without treatment. Your health care provider may recommend:  Drinking more fluids. Drinking more makes your mucus thinner, which may make it easier to breathe.  Taking a medicine for a fever or cough.  Taking an antibiotic medicine.  Using an inhaler to help improve shortness of breath and to control a cough.  Using a cool mist vaporizer or humidifier to make it easier to breathe.  Follow these instructions at home: Medicines  Take over-the-counter and prescription medicines only as told by your health care provider.  If you were prescribed an antibiotic, take it as told by your health care provider. Do not stop taking the antibiotic even if you start to feel better. General instructions    Get plenty of rest.  Drink enough fluids to keep your urine clear or pale yellow.  Avoid smoking and secondhand smoke. Exposure to cigarette smoke or irritating chemicals will make bronchitis worse. If you smoke and you need help quitting, ask your health care provider. Quitting smoking will help your lungs heal faster.  Use an inhaler, cool mist vaporizer, or humidifier as told  by your health care provider.  Keep all follow-up visits as told by your health care provider. This is important. How is this prevented? To lower your risk of getting this condition again:  Wash your hands often with soap and water. If soap and water are not available, use hand sanitizer.  Avoid contact with people who have cold symptoms.  Try not to touch your hands to your mouth, nose, or eyes.  Make sure to get the flu shot every year.  Contact a health care provider if:  Your symptoms do not improve in 2 weeks of treatment. Get help right away if:  You cough up blood.  You have chest pain.  You have severe shortness of breath.  You become dehydrated.  You faint or keep feeling like you are going to faint.  You keep vomiting.  You have a severe headache.  Your fever or chills gets worse. This information is not intended to replace advice given to you by your health care provider. Make sure you discuss any questions you have with your health care provider. Document Released: 12/01/2004 Document Revised: 05/18/2016 Document Reviewed: 04/13/2016 Elsevier Interactive Patient Education  2018 [employer].  Influenza, Adult Influenza, more commonly known as "the flu," is a viral infection that primarily affects the respiratory tract. The respiratory tract includes organs that help you breathe, such as the lungs, nose, and throat. The flu causes many common cold symptoms, as well as a high fever and body aches. The flu spreads easily from person to person (is contagious). Getting a flu shot (influenza vaccination) every year is the best way to prevent influenza. What are the causes? Influenza is caused by a virus. You can catch the virus by:  Breathing in droplets from an infected person's cough or sneeze.  Touching something that was recently contaminated with the virus and then touching your mouth, nose, or eyes.  What increases the risk? The following factors may make  you more likely to get the flu:  Not cleaning your hands frequently with soap and water or alcohol-based hand sanitizer.  Having close contact with many people during cold and flu season.  Touching your mouth, eyes, or nose without washing or sanitizing your hands first.  Not drinking enough fluids or not eating a healthy diet.  Not getting enough sleep or exercise.  Being under a high amount of stress.  Not getting a yearly (annual) flu shot.  You may be at a higher risk of complications from the flu, such as a severe lung infection (pneumonia), if you:  Are over the age of 31.  Are pregnant.  Have a weakened disease-fighting system (immune system). You may have a weakened immune system if you: ? Have HIV or AIDS. ? Are undergoing chemotherapy. ? Aretaking medicines that reduce the activity of (suppress) the immune system.  Have a long-term (chronic) illness, such as heart disease, kidney disease, diabetes, or lung disease.  Have a liver disorder.  Are obese.  Have anemia.  What are the signs or symptoms? Symptoms of this condition typically last 4-10 days and may include:  Fever.  Chills.  Headache, body aches, or muscle aches.  Sore throat.  Cough.  Runny or congested nose.  Chest discomfort and cough.  Poor appetite.  Weakness or tiredness (fatigue).  Dizziness.  Nausea or vomiting.  How is this diagnosed? This condition may be diagnosed based on your medical history and a physical exam. Your health care provider may do a nose or throat swab test to confirm the diagnosis. How is this treated? If influenza is detected early, you can be treated with antiviral medicine that can reduce the length of your illness and the severity of your symptoms. This medicine may be given by mouth (orally) or through an IV tube that is inserted in one of your veins. The goal of treatment is to relieve symptoms by taking care of yourself at home. This may include  taking over-the-counter medicines, drinking plenty of fluids, and adding humidity to the air in your home. In some cases, influenza goes away on its own. Severe influenza or complications from influenza may be treated in a hospital. Follow these instructions at home:  Take over-the-counter and prescription medicines only as told by your health care provider.  Use a cool mist humidifier to add humidity to the air in your home. This can make breathing easier.  Rest as needed.  Drink enough fluid to keep your urine clear or pale yellow.  Cover your mouth and nose when you cough or sneeze.  Wash your hands with soap and water often, especially after you cough or sneeze. If soap and water are not available, use hand sanitizer.  Stay home from work or school as told by your health care provider. Unless you are visiting your health care provider, try to avoid leaving home until your fever has been gone for 24 hours without the use of medicine.  Keep all follow-up visits as told by your health care provider. This is important. How is this prevented?  Getting an annual flu shot is the best way to avoid getting the flu. You may get the flu shot in late summer, fall, or winter. Ask your health care provider when you should get your flu shot.  Wash your hands often or use hand sanitizer often.  Avoid contact with people who are sick during cold and flu season.  Eat a healthy diet, drink plenty of fluids, get enough sleep, and exercise regularly. Contact a health care provider if:  You develop new symptoms.  You have: ? Chest pain. ? Diarrhea. ? A fever.  Your cough gets worse.  You produce more mucus.  You feel nauseous or you vomit. Get help right away if:  You develop shortness of breath or difficulty breathing.  Your skin or nails turn a bluish color.  You have severe pain or stiffness in your neck.  You develop a sudden headache or sudden pain in your face or ear.  You  cannot stop vomiting. This information is not intended to replace advice given to you by your health care provider. Make sure you discuss any questions you have with your health care provider. Document Released: 10/21/2000 Document Revised: 03/31/2016 Document Reviewed: 08/18/2015 Elsevier Interactive Patient Education  2017 [employer].

## 2017-12-20 NOTE — Progress Notes (Signed)
Subjective:    Patient ID: Sarah Beasley, female    DOB: Apr 26, 1961, 57 y.o.   MRN: [id number]  HPI Flu symptoms (leg aches, congestion,) and cough x 2 days. Cough is getting worse, congestion is beginning to subside. Patient took Advil, which helped with her body aches, and Robitussin, which did not help with her cough. Patient had a temperature of 100.2 degrees F x 2 days ago, but her temperature has since decreased. Associated symptoms include SOB, a decreased appetite, weakness and fatigue. Patient does not have chest pain.  Patient Active Problem List   Diagnosis Date Noted  . Hematuria 07/07/2017   No Known Allergies   Prior to Admission medications   Medication Sig Start Date End Date Taking? Authorizing Provider  doxylamine, Sleep, (UNISOM) 25 MG tablet Take 25 mg by mouth at bedtime as needed.   Yes [provider]  ibuprofen (ADVIL,MOTRIN) 100 MG tablet Take 100 mg by mouth every 6 (six) hours as needed for fever.   Yes [provider]  Multiple Vitamin (MULTIVITAMIN) tablet Take 1 tablet by mouth daily.   Yes [provider]  azelastine (ASTELIN) 0.1 % nasal spray Place 2 sprays into both nostrils 2 (two) times daily. Use in each nostril as directed 12/20/17   Harrison Mons, PA-C  benzonatate (TESSALON) 100 MG capsule Take 1-2 capsules (100-200 mg total) by mouth 3 (three) times daily as needed for cough. 12/20/17   Harrison Mons, PA-C  Guaifenesin (MUCINEX MAXIMUM STRENGTH) 1200 MG TB12 Take 1 tablet (1,200 mg total) by mouth every 12 (twelve) hours as needed. 12/20/17   Harrison Mons, PA-C   Past Medical History:  Diagnosis Date  . Heart murmur   . Hypertension    Social History   Socioeconomic History  . Marital status: Single    Spouse name: Not on file  . Number of children: 0  . Years of education: BA  . Highest education level: Not on file  Social Needs  . Financial resource strain: Not on file  . Food insecurity - worry: Not on  file  . Food insecurity - inability: Not on file  . Transportation needs - medical: Not on file  . Transportation needs - non-medical: Not on file  Occupational History  . Not on file  Tobacco Use  . Smoking status: Never Smoker  . Smokeless tobacco: Never Used  Substance and Sexual Activity  . Alcohol use: Yes    Alcohol/week: 3.0 oz    Types: 5 Standard drinks or equivalent per week  . Drug use: No  . Sexual activity: Not on file  Other Topics Concern  . Not on file  Social History Narrative   Exercise walking for 1 hour   Drinks 1 cup of coffee in the morning.    Family History  Problem Relation Age of Onset  . Hypertension Mother   . Cancer Mother   . Cancer Father   . Diabetes Father   . Heart disease Father   . Hypertension Father   . Hypertension Sister   . Cancer Brother   . Diabetes Brother   . Hypertension Brother   . Hypertension Sister    Past Surgical History:  Procedure Laterality Date  . KNEE SURGERY Left     Review of Systems  Constitutional: Positive for appetite change, fatigue and fever.  HENT: Positive for congestion, postnasal drip and rhinorrhea. Negative for ear pain, sinus pressure and sinus pain.   Eyes: Negative.   Respiratory:  Positive for cough and shortness of breath. Negative for chest tightness and wheezing.   Cardiovascular: Negative.        Objective:   Physical Exam  Constitutional: She appears well-developed and well-nourished.  BP 118/68   Pulse 100   Temp 99.6 F (37.6 C) (Oral)   Resp 18   Ht 5' (1.524 m)   Wt 136 lb (61.7 kg)   SpO2 96%   BMI 26.56 kg/m   HENT:  Head: Normocephalic and atraumatic.  Right Ear: External ear normal.  Left Ear: External ear normal.  Hoarseness is evident. Oropharynx erythematous and mucus-producing. Nasal mucosa erythematous.  Eyes: Conjunctivae and EOM are normal. Pupils are equal, round, and reactive to light.  Neck: Normal range of motion. Neck supple.  Cardiovascular: Normal  rate, regular rhythm and normal heart sounds.  Pulmonary/Chest: Effort normal. She has wheezes.    Results for orders placed or performed in visit on 12/20/17  POCT Influenza A/B  Result Value Ref Range   Influenza A, POC Negative Negative   Influenza B, POC Negative Negative   Dg Chest 2 View  Result Date: 12/20/2017 CLINICAL DATA:  Cough, shortness of breath EXAM: CHEST  2 VIEW COMPARISON:  03/10/2017 FINDINGS: Normal heart size, mediastinal contours, and pulmonary vascularity. Bronchitic changes without pulmonary infiltrate, pleural effusion or pneumothorax. Bones demineralized with dextroconvex lower thoracic scoliosis. IMPRESSION: Bronchitic changes without infiltrate. Electronically Signed   By: Lavonia Dana M.D.   On: 12/20/2017 16:42      Assessment & Plan:  1. Flu-like symptoms - POCT Influenza A/B was negatve  2. Cough - DG Chest 2 View; Future - azelastine (ASTELIN) 0.1 % nasal spray; Place 2 sprays into both nostrils 2 (two) times daily. Use in each nostril as directed  Dispense: 30 mL; Refill: 0 - benzonatate (TESSALON) 100 MG capsule; Take 1-2 capsules (100-200 mg total) by mouth 3 (three) times daily as needed for cough.  Dispense: 40 capsule; Refill: 0 - Guaifenesin (MUCINEX MAXIMUM STRENGTH) 1200 MG TB12; Take 1 tablet (1,200 mg total) by mouth every 12 (twelve) hours as needed.  Dispense: 14 tablet; Refill: 1  3. Shortness of breath - DG Chest 2 View; Future  Return if symptoms worsen or fail to improve in the next 72 hours.

## 2017-12-20 NOTE — Progress Notes (Signed)
Patient ID: Sarah Beasley, female    DOB: September 07, 1961, 57 y.o.   MRN: [id number]  PCP: [name], MD  Chief Complaint  Patient presents with  . Flu like symptoms  . Rash    Abdomen    Subjective:   Presents for evaluation of 5 days of illness.  Body aches, congestion, cough. Cough is progressively worsening. Feels SOB, tired, achy.  No flu vaccine this season.  Review of Systems As above.    Patient Active Problem List   Diagnosis Date Noted  . Hematuria 07/07/2017     Prior to Admission medications   Medication Sig Start Date End Date Taking? Authorizing Provider  doxylamine, Sleep, (UNISOM) 25 MG tablet Take 25 mg by mouth at bedtime as needed.   Yes [provider]  guaifenesin (ROBITUSSIN) 100 MG/5ML syrup Take 200 mg by mouth 3 (three) times daily as needed for cough.   Yes [provider]  ibuprofen (ADVIL,MOTRIN) 100 MG tablet Take 100 mg by mouth every 6 (six) hours as needed for fever.   Yes [provider]  Multiple Vitamin (MULTIVITAMIN) tablet Take 1 tablet by mouth daily.   Yes [provider]     No Known Allergies     Objective:  Physical Exam  Constitutional: She is oriented to person, place, and time. She appears well-developed and well-nourished. No distress.  BP 118/68   Pulse 100   Temp 99.6 F (37.6 C) (Oral)   Resp 18   Ht 5' (1.524 m)   Wt 136 lb (61.7 kg)   SpO2 96%   BMI 26.56 kg/m    HENT:  Head: Normocephalic and atraumatic.  Right Ear: Hearing, tympanic membrane, external ear and ear canal normal.  Left Ear: Hearing, tympanic membrane, external ear and ear canal normal.  Nose: Mucosal edema (mild) and rhinorrhea (clear) present.  No foreign bodies. Right sinus exhibits maxillary sinus tenderness. Right sinus exhibits no frontal sinus tenderness. Left sinus exhibits maxillary sinus tenderness. Left sinus exhibits no frontal sinus tenderness.  Mouth/Throat: Uvula is midline,  oropharynx is clear and moist and mucous membranes are normal. No uvula swelling. No oropharyngeal exudate.  Eyes: Conjunctivae and EOM are normal. Pupils are equal, round, and reactive to light. Right eye exhibits no discharge. Left eye exhibits no discharge. No scleral icterus.  Neck: Trachea normal, normal range of motion and full passive range of motion without pain. Neck supple. No thyroid mass and no thyromegaly present.  Cardiovascular: Normal rate, regular rhythm and normal heart sounds.  Pulmonary/Chest: Effort normal and breath sounds normal.  Lymphadenopathy:       Head (right side): No submandibular, no tonsillar, no preauricular, no posterior auricular and no occipital adenopathy present.       Head (left side): No submandibular, no tonsillar, no preauricular and no occipital adenopathy present.    She has no cervical adenopathy.       Right: No supraclavicular adenopathy present.       Left: No supraclavicular adenopathy present.  Neurological: She is alert and oriented to person, place, and time. She has normal strength. No cranial nerve deficit or sensory deficit.  Skin: Skin is warm, dry and intact. No rash noted.  Psychiatric: She has a normal mood and affect. Her speech is normal and behavior is normal.    Results for orders placed or performed in visit on 12/20/17  POCT Influenza A/B  Result Value Ref Range   Influenza A, POC Negative  Negative   Influenza B, POC Negative Negative    Dg Chest 2 View  Result Date: 12/20/2017 CLINICAL DATA:  Cough, shortness of breath EXAM: CHEST  2 VIEW COMPARISON:  03/10/2017 FINDINGS: Normal heart size, mediastinal contours, and pulmonary vascularity. Bronchitic changes without pulmonary infiltrate, pleural effusion or pneumothorax. Bones demineralized with dextroconvex lower thoracic scoliosis. IMPRESSION: Bronchitic changes without infiltrate. Electronically Signed   By: Lavonia Dana M.D.   On: 12/20/2017 16:42       Assessment &  Plan:   1. Flu-like symptoms 2. Cough 3. Shortness of breath Negative nasal swab for flu.  Chest x-ray is reassuring.  Supportive care.  Anticipatory guidance provided.  If symptoms are not improving in the next 72 hours, or if they worsen, would send in antibiotic treatment. - POCT Influenza A/B - DG Chest 2 View; Future - azelastine (ASTELIN) 0.1 % nasal spray; Place 2 sprays into both nostrils 2 (two) times daily. Use in each nostril as directed  Dispense: 30 mL; Refill: 0 - benzonatate (TESSALON) 100 MG capsule; Take 1-2 capsules (100-200 mg total) by mouth 3 (three) times daily as needed for cough.  Dispense: 40 capsule; Refill: 0 - Guaifenesin (MUCINEX MAXIMUM STRENGTH) 1200 MG TB12; Take 1 tablet (1,200 mg total) by mouth every 12 (twelve) hours as needed.  Dispense: 14 tablet; Refill: 1   Return if symptoms worsen or fail to improve in the next 72 hours.   Fara Chute, PA-C [facility]

## 2018-01-04 ENCOUNTER — Other Ambulatory Visit: Payer: Self-pay

## 2018-01-04 ENCOUNTER — Ambulatory Visit (INDEPENDENT_AMBULATORY_CARE_PROVIDER_SITE_OTHER): Payer: BLUE CROSS/BLUE SHIELD | Admitting: Family Medicine

## 2018-01-04 ENCOUNTER — Encounter: Payer: Self-pay | Admitting: Family Medicine

## 2018-01-04 VITALS — BP 116/72 | HR 82 | Temp 98.0°F | Resp 16 | Ht 59.84 in | Wt 136.0 lb

## 2018-01-04 DIAGNOSIS — Z23 Encounter for immunization: Secondary | ICD-10-CM

## 2018-01-04 DIAGNOSIS — Z7189 Other specified counseling: Secondary | ICD-10-CM | POA: Diagnosis not present

## 2018-01-04 DIAGNOSIS — Z7184 Encounter for health counseling related to travel: Secondary | ICD-10-CM

## 2018-01-04 MED ORDER — TYPHOID VACCINE PO CPDR: 1.0000 | DELAYED_RELEASE_CAPSULE | ORAL | 0 refills | Status: DC

## 2018-01-04 NOTE — Progress Notes (Signed)
Subjective:  By signing my name below, I, Sarah Beasley, attest that this documentation has been prepared under the direction and in the presence of Sarah Raspberry Ranell Patrick, MD.  Electronically Signed: Theresia Beasley, Medical Scribe 01/04/18 at 2:24 PM   Patient ID: Sarah Beasley, female    DOB: Nov 17, 1960, 57 y.o.   MRN: [id number] Chief Complaint  Patient presents with  . Immunizations    pt is going out of the country and need vaccinations   HPI Sarah Beasley is a 57 y.o. female who presents to [facility] at [facility] for travel consultation. She will be going to [place] (8 days) and urban [place] (2 days) on 01/13/18 for work. She traveled to this area over 10 years ago as well as [place] and [place]. She has not had a flu shot this season. She is unsure of her hepatitis A/B vaccination status but feels that she had one in [place]. She will try to get access that that information. She is agreeable to start prophylactic treatment for typhoid. She reports that she was seen 12/20/17 for flu-like symptoms but she states she is recovered now. She denies adverse reactions to vaccines in the past.   Immunization History  Administered Date(s) Administered  . Influenza,inj,Quad PF,6+ Mos 09/24/2015  . Tdap 04/14/2016     Patient Active Problem List   Diagnosis Date Noted  . Hematuria 07/07/2017   Past Medical History:  Diagnosis Date  . Heart murmur   . Hypertension    Past Surgical History:  Procedure Laterality Date  . KNEE SURGERY Left    No Known Allergies Prior to Admission medications   Medication Sig Start Date End Date Taking? Authorizing Provider  azelastine (ASTELIN) 0.1 % nasal spray Place 2 sprays into both nostrils 2 (two) times daily. Use in each nostril as directed 12/20/17   Harrison Mons, PA-C  benzonatate (TESSALON) 100 MG capsule Take 1-2 capsules (100-200 mg total) by mouth 3 (three) times daily as needed for cough. 12/20/17   Harrison Mons, PA-C    doxylamine, Sleep, (UNISOM) 25 MG tablet Take 25 mg by mouth at bedtime as needed.    [provider]  Guaifenesin (MUCINEX MAXIMUM STRENGTH) 1200 MG TB12 Take 1 tablet (1,200 mg total) by mouth every 12 (twelve) hours as needed. 12/20/17   Harrison Mons, PA-C  ibuprofen (ADVIL,MOTRIN) 100 MG tablet Take 100 mg by mouth every 6 (six) hours as needed for fever.    [provider]  Multiple Vitamin (MULTIVITAMIN) tablet Take 1 tablet by mouth daily.    [provider]   Social History   Socioeconomic History  . Marital status: Single    Spouse name: Not on file  . Number of children: 0  . Years of education: BA  . Highest education level: Not on file  Social Needs  . Financial resource strain: Not on file  . Food insecurity - worry: Not on file  . Food insecurity - inability: Not on file  . Transportation needs - medical: Not on file  . Transportation needs - non-medical: Not on file  Occupational History  . Not on file  Tobacco Use  . Smoking status: Never Smoker  . Smokeless tobacco: Never Used  Substance and Sexual Activity  . Alcohol use: Yes    Alcohol/week: 3.0 oz    Types: 5 Standard drinks or equivalent per week  . Drug use: No  . Sexual activity: Not on file  Other Topics Concern  .  Not on file  Social History Narrative   Exercise walking for 1 hour   Drinks 1 cup of coffee in the morning.    Review of Systems  Constitutional: Negative for fever.  HENT: Negative for sore throat.   Respiratory: Negative for cough.        Objective:   Physical Exam  Constitutional: She appears well-developed and well-nourished. No distress.  HENT:  Head: Normocephalic and atraumatic.  Eyes: Conjunctivae are normal. Pupils are equal, round, and reactive to light. No scleral icterus.  Neck: Neck supple.  Cardiovascular: Normal rate.  Pulmonary/Chest: Effort normal. No respiratory distress.  Neurological: She is alert.  Skin: She is not diaphoretic.     Vitals:   01/04/18 1350  BP: 116/72  Pulse: 82  Resp: 16  Temp: 98 F (36.7 C)  TempSrc: Oral  SpO2: 98%  Weight: 136 lb (61.7 kg)  Height: 4' 11.84" (1.52 m)      Assessment & Plan:   LYNNE TAKEMOTO is a 57 y.o. female Counseling about travel  Needs flu shot - Plan: Flu Vaccine QUAD 36+ mos IM  Need for prophylactic vaccination and inoculation against viral hepatitis - Plan: Hepatitis A vaccine adult IM  Need for immunization against typhoid - Plan: typhoid (VIVOTIF) DR capsule  Reviewed recommendations from CDC based on locations where she is traveling. No high risk travel expected or rural travel. Unknown hep A/hep B vaccination status, but may have had dose given at previous job. Plans to obtain those records if possible to determine further vaccines.   -Given upcoming trip, Will give hep A vaccine #1, discussed partial protection given timing and need for 2 vaccines.   -She plans on strict mosquito prevention with sprays. Malaria prophylaxis was declined as well as yellow fever. Typhoid vaccine with oral Vivotif provided.   Meds ordered this encounter  Medications  . typhoid (VIVOTIF) DR capsule    Sig: Take 1 capsule by mouth every other day.    Dispense:  4 capsule    Refill:  0   Patient Instructions    1st Hep A and flu vaccine given today. If you can find out about your previous vaccines, let me know. If not we can draw blood for a titer in the future.   Use mosquito repellant to avoid mosquitoes.   Vivotif for typhoid prevention - start today if possible.   See CDC for more travel info. Have a safe trip.       IF you received an x-ray today, you will receive an invoice from [facility]. Please contact [facility] at [phone] with questions or concerns regarding your invoice.   IF you received labwork today, you will receive an invoice from [place]. Please contact [facility] at [phone] with questions or concerns regarding  your invoice.   Our billing staff will not be able to assist you with questions regarding bills from these companies.  You will be contacted with the lab results as soon as they are available. The fastest way to get your results is to activate your My Chart account. Instructions are located on the last page of this paperwork. If you have not heard from [place] regarding the results in 2 weeks, please contact this office.       I personally performed the services described in this documentation, which was scribed in my presence. The recorded information has been reviewed and considered for accuracy and completeness, addended by me as needed, and agree with information above.  Signed,   Merri Ray, MD [facility].  01/06/18 11:50 AM

## 2018-01-04 NOTE — Patient Instructions (Addendum)
  1st Hep A and flu vaccine given today. If you can find out about your previous vaccines, let me know. If not we can draw blood for a titer in the future.   Use mosquito repellant to avoid mosquitoes.   Vivotif for typhoid prevention - start today if possible.   See CDC for more travel info. Have a safe trip.       IF you received an x-ray today, you will receive an invoice from [facility]. Please contact [facility] at [phone] with questions or concerns regarding your invoice.   IF you received labwork today, you will receive an invoice from [place]. Please contact [facility] at [phone] with questions or concerns regarding your invoice.   Our billing staff will not be able to assist you with questions regarding bills from these companies.  You will be contacted with the lab results as soon as they are available. The fastest way to get your results is to activate your My Chart account. Instructions are located on the last page of this paperwork. If you have not heard from [place] regarding the results in 2 weeks, please contact this office.

## 2018-01-06 ENCOUNTER — Encounter: Payer: Self-pay | Admitting: Family Medicine

## 2018-01-29 ENCOUNTER — Ambulatory Visit
Admission: RE | Admit: 2018-01-29 | Discharge: 2018-01-29 | Disposition: A | Discharge status: Home / Self Care | Payer: BLUE CROSS/BLUE SHIELD | Source: Ambulatory Visit | Attending: Urology | Admitting: Urology

## 2018-01-29 DIAGNOSIS — D734 Cyst of spleen: Secondary | ICD-10-CM

## 2018-01-29 DIAGNOSIS — K8020 Calculus of gallbladder without cholecystitis without obstruction: Secondary | ICD-10-CM | POA: Diagnosis not present

## 2018-01-29 MED ORDER — GADOBENATE DIMEGLUMINE 529 MG/ML IV SOLN
13.0000 mL | Freq: Once | INTRAVENOUS | Status: AC | PRN
  Administered 2018-01-29: 13 mL via INTRAVENOUS

## 2018-01-31 DIAGNOSIS — R3121 Asymptomatic microscopic hematuria: Secondary | ICD-10-CM | POA: Diagnosis not present

## 2018-09-10 ENCOUNTER — Encounter (HOSPITAL_BASED_OUTPATIENT_CLINIC_OR_DEPARTMENT_OTHER): Payer: Self-pay | Admitting: *Deleted

## 2018-09-10 ENCOUNTER — Other Ambulatory Visit: Payer: Self-pay

## 2018-09-10 ENCOUNTER — Emergency Department (HOSPITAL_BASED_OUTPATIENT_CLINIC_OR_DEPARTMENT_OTHER)
Admission: EM | Admit: 2018-09-10 | Discharge: 2018-09-10 | Disposition: A | Discharge status: Home / Self Care | Payer: BLUE CROSS/BLUE SHIELD | Attending: Emergency Medicine | Admitting: Emergency Medicine

## 2018-09-10 DIAGNOSIS — I10 Essential (primary) hypertension: Secondary | ICD-10-CM | POA: Diagnosis not present

## 2018-09-10 DIAGNOSIS — Z203 Contact with and (suspected) exposure to rabies: Secondary | ICD-10-CM | POA: Diagnosis not present

## 2018-09-10 DIAGNOSIS — Y998 Other external cause status: Secondary | ICD-10-CM | POA: Insufficient documentation

## 2018-09-10 DIAGNOSIS — Y9389 Activity, other specified: Secondary | ICD-10-CM | POA: Diagnosis not present

## 2018-09-10 DIAGNOSIS — S60470A Other superficial bite of right index finger, initial encounter: Secondary | ICD-10-CM | POA: Insufficient documentation

## 2018-09-10 DIAGNOSIS — Z2914 Encounter for prophylactic rabies immune globin: Secondary | ICD-10-CM | POA: Insufficient documentation

## 2018-09-10 DIAGNOSIS — Y929 Unspecified place or not applicable: Secondary | ICD-10-CM | POA: Diagnosis not present

## 2018-09-10 DIAGNOSIS — Z79899 Other long term (current) drug therapy: Secondary | ICD-10-CM | POA: Insufficient documentation

## 2018-09-10 DIAGNOSIS — W5389XA Other contact with other rodent, initial encounter: Secondary | ICD-10-CM | POA: Insufficient documentation

## 2018-09-10 DIAGNOSIS — Z23 Encounter for immunization: Secondary | ICD-10-CM

## 2018-09-10 MED ORDER — RABIES IMMUNE GLOBULIN 150 UNIT/ML IM INJ
20.0000 [IU]/kg | INJECTION | Freq: Once | INTRAMUSCULAR | Status: AC
Start: 2018-09-10 — End: 2018-09-10
  Administered 2018-09-10: 1200 [IU] via INTRAMUSCULAR
  Filled 2018-09-10: qty 8

## 2018-09-10 MED ORDER — RABIES VACCINE, PCEC IM SUSR
1.0000 mL | Freq: Once | INTRAMUSCULAR | Status: AC
  Administered 2018-09-10: 1 mL via INTRAMUSCULAR
  Filled 2018-09-10: qty 1

## 2018-09-10 NOTE — ED Provider Notes (Signed)
[facility] EMERGENCY DEPARTMENT Provider Note   CSN: [id number] Arrival date & time: 09/10/18  1234     History   Chief Complaint Chief Complaint  Patient presents with  . Animal Bite    HPI LATONGA PONDER is a 57 y.o. female.  The history is provided by the patient and medical records. No language interpreter was used.  Animal Bite  Associated symptoms: no fever    CHERAY PARDI is a 57 y.o. female who presents to the emergency department for concerns of being bitten by a bat.  Patient states that back in August, there was a bat flying around her bedroom when she woke up.  She called animal control who came and called the bat and tested it.  It tested negative for rabies.  She did not receive any rabies vaccinations at that time since they were able to contain the animal and test it.  She awoke yesterday morning with 2 small red marks on her right index finger.  She has no idea how she got these wounds and is very nervous that this is from a bat bite.  She is requesting rabies vaccinations given her concern and having bats in her home before.  Past Medical History:  Diagnosis Date  . Heart murmur   . Hypertension     Patient Active Problem List   Diagnosis Date Noted  . Hematuria 07/07/2017    Past Surgical History:  Procedure Laterality Date  . KNEE SURGERY Left      OB History   None      Home Medications    Prior to Admission medications   Medication Sig Start Date End Date Taking? Authorizing Provider  typhoid (VIVOTIF) DR capsule Take 1 capsule by mouth every other day. 01/04/18   [name], MD    Family History Family History  Problem Relation Age of Onset  . Hypertension Mother   . Cancer Mother   . Cancer Father   . Diabetes Father   . Heart disease Father   . Hypertension Father   . Hypertension Sister   . Cancer Brother   . Diabetes Brother   . Hypertension Brother   . Hypertension Sister     Social History Social  History   Tobacco Use  . Smoking status: Never Smoker  . Smokeless tobacco: Never Used  Substance Use Topics  . Alcohol use: Yes    Alcohol/week: 5.0 standard drinks    Types: 5 Standard drinks or equivalent per week  . Drug use: No     Allergies   Patient has no known allergies.   Review of Systems Review of Systems  Constitutional: Negative for fever.  Musculoskeletal: Negative for arthralgias and myalgias.  Skin: Positive for wound.     Physical Exam Updated Vital Signs BP (!) 163/103 (BP Location: Left Arm)   Pulse 87   Temp 98.1 F (36.7 C) (Oral)   Resp 16   Ht 5' (1.524 m)   Wt 59 kg   SpO2 99%   BMI 25.39 kg/m   Physical Exam  Constitutional: She appears well-developed and well-nourished. No distress.  HENT:  Head: Normocephalic and atraumatic.  Neck: Neck supple.  Cardiovascular: Normal rate, regular rhythm and normal heart sounds.  No murmur heard. Pulmonary/Chest: Effort normal and breath sounds normal. No respiratory distress. She has no wheezes. She has no rales.  Musculoskeletal: Normal range of motion.  Neurological: She is alert.  Skin: Skin is warm and  dry.  Two pinpoint erythematous wounds to the right index finger.  Tenderness.  No warmth.  Nursing note and vitals reviewed.    ED Treatments / Results  Labs (all labs ordered are listed, but only abnormal results are displayed) Labs Reviewed - No data to display  EKG None  Radiology No results found.  Procedures Procedures (including critical care time)  Medications Ordered in ED Medications  rabies vaccine (RABAVERT) injection 1 mL (1 mL Intramuscular Given 09/10/18 1324)  rabies immune globulin (HYPERAB/KEDRAB) injection 1,200 Units (1,200 Units Intramuscular Given 09/10/18 1323)     Initial Impression / Assessment and Plan / ED Course  I have reviewed the triage vital signs and the nursing notes.  Pertinent labs & imaging results that were available during my care of the  patient were reviewed by me and considered in my medical decision making (see chart for details).    SHANISHA LECH is a 57 y.o. female who presents to ED for rabies vaccine after possible bat exposure. Rabies vaccines given. Understands follow up for subsequent vaccines. All questions answered.     Final Clinical Impressions(s) / ED Diagnoses   Final diagnoses:  Need for rabies vaccination    ED Discharge Orders    None       Edrian Melucci, Ozella Almond, PA-C 09/10/18 1354    Varney Biles, MD 09/10/18 1552

## 2018-09-10 NOTE — ED Triage Notes (Signed)
Possible bat bite. She woke yesterday with red marks that she feels may be bat teeth marks. She never saw a bat. States she had a bat in her bedroom in August that tested negative for rabies.

## 2018-09-10 NOTE — Discharge Instructions (Addendum)
Follow up for your next rabies vaccines as listed on your paperwork already given.   Return to ER for new or worsening symptoms, any additional concerns.

## 2018-09-11 ENCOUNTER — Ambulatory Visit: Payer: BLUE CROSS/BLUE SHIELD | Admitting: Family Medicine

## 2018-09-13 ENCOUNTER — Ambulatory Visit (HOSPITAL_COMMUNITY)
Admission: EM | Admit: 2018-09-13 | Discharge: 2018-09-13 | Disposition: A | Discharge status: Home / Self Care | Payer: BLUE CROSS/BLUE SHIELD | Attending: Family Medicine | Admitting: Family Medicine

## 2018-09-13 DIAGNOSIS — Z203 Contact with and (suspected) exposure to rabies: Secondary | ICD-10-CM

## 2018-09-13 DIAGNOSIS — Z23 Encounter for immunization: Secondary | ICD-10-CM | POA: Diagnosis not present

## 2018-09-13 MED ORDER — RABIES VACCINE, PCEC IM SUSR
INTRAMUSCULAR | Status: AC
  Filled 2018-09-13: qty 1

## 2018-09-13 MED ORDER — RABIES VACCINE, PCEC IM SUSR
1.0000 mL | Freq: Once | INTRAMUSCULAR | Status: AC
  Administered 2018-09-13: 1 mL via INTRAMUSCULAR

## 2018-09-13 NOTE — ED Triage Notes (Signed)
Pt here for day three rabies shot.

## 2018-09-17 ENCOUNTER — Encounter (HOSPITAL_COMMUNITY): Payer: Self-pay | Admitting: Emergency Medicine

## 2018-09-17 ENCOUNTER — Ambulatory Visit (HOSPITAL_COMMUNITY)
Admission: EM | Admit: 2018-09-17 | Discharge: 2018-09-17 | Disposition: A | Discharge status: Home / Self Care | Payer: BLUE CROSS/BLUE SHIELD | Attending: Family Medicine | Admitting: Family Medicine

## 2018-09-17 ENCOUNTER — Other Ambulatory Visit: Payer: Self-pay | Admitting: Family Medicine

## 2018-09-17 ENCOUNTER — Other Ambulatory Visit: Payer: Self-pay

## 2018-09-17 DIAGNOSIS — Z1231 Encounter for screening mammogram for malignant neoplasm of breast: Secondary | ICD-10-CM

## 2018-09-17 DIAGNOSIS — Z23 Encounter for immunization: Secondary | ICD-10-CM

## 2018-09-17 DIAGNOSIS — Z203 Contact with and (suspected) exposure to rabies: Secondary | ICD-10-CM

## 2018-09-17 MED ORDER — RABIES VACCINE, PCEC IM SUSR
INTRAMUSCULAR | Status: AC
  Filled 2018-09-17: qty 1

## 2018-09-17 MED ORDER — RABIES VACCINE, PCEC IM SUSR
1.0000 mL | Freq: Once | INTRAMUSCULAR | Status: AC
  Administered 2018-09-17: 1 mL via INTRAMUSCULAR

## 2018-09-17 NOTE — Discharge Instructions (Signed)
Please return to the [facility] on 09/24/2018 for your final rabies booster injection.

## 2018-09-17 NOTE — ED Triage Notes (Signed)
The patient presented to the [facility] to receive the Day 7 rabies vaccination booster.

## 2018-09-18 ENCOUNTER — Ambulatory Visit
Admission: RE | Admit: 2018-09-18 | Discharge: 2018-09-18 | Disposition: A | Discharge status: Home / Self Care | Payer: BLUE CROSS/BLUE SHIELD | Source: Ambulatory Visit | Attending: Family Medicine | Admitting: Family Medicine

## 2018-09-18 DIAGNOSIS — Z1231 Encounter for screening mammogram for malignant neoplasm of breast: Secondary | ICD-10-CM | POA: Diagnosis not present

## 2018-09-20 ENCOUNTER — Other Ambulatory Visit: Payer: Self-pay | Admitting: Family Medicine

## 2018-09-20 DIAGNOSIS — R928 Other abnormal and inconclusive findings on diagnostic imaging of breast: Secondary | ICD-10-CM

## 2018-09-24 ENCOUNTER — Encounter (HOSPITAL_COMMUNITY): Payer: Self-pay | Admitting: Emergency Medicine

## 2018-09-24 ENCOUNTER — Ambulatory Visit (HOSPITAL_COMMUNITY)
Admission: EM | Admit: 2018-09-24 | Discharge: 2018-09-24 | Disposition: A | Discharge status: Home / Self Care | Payer: BLUE CROSS/BLUE SHIELD | Attending: Family Medicine | Admitting: Family Medicine

## 2018-09-24 DIAGNOSIS — Z23 Encounter for immunization: Secondary | ICD-10-CM | POA: Diagnosis not present

## 2018-09-24 DIAGNOSIS — Z203 Contact with and (suspected) exposure to rabies: Secondary | ICD-10-CM | POA: Diagnosis not present

## 2018-09-24 MED ORDER — RABIES VACCINE, PCEC IM SUSR
INTRAMUSCULAR | Status: AC
  Filled 2018-09-24: qty 1

## 2018-09-24 MED ORDER — RABIES VACCINE, PCEC IM SUSR
1.0000 mL | Freq: Once | INTRAMUSCULAR | Status: AC
  Administered 2018-09-24: 1 mL via INTRAMUSCULAR

## 2018-09-24 NOTE — ED Triage Notes (Signed)
Pt her for 4th rabies injection; given in right arm; pt tolerated well

## 2018-09-25 ENCOUNTER — Other Ambulatory Visit: Payer: Self-pay | Admitting: Family Medicine

## 2018-09-25 ENCOUNTER — Ambulatory Visit
Admission: RE | Admit: 2018-09-25 | Discharge: 2018-09-25 | Disposition: A | Discharge status: Home / Self Care | Payer: BLUE CROSS/BLUE SHIELD | Source: Ambulatory Visit | Attending: Family Medicine | Admitting: Family Medicine

## 2018-09-25 DIAGNOSIS — N6489 Other specified disorders of breast: Secondary | ICD-10-CM | POA: Diagnosis not present

## 2018-09-25 DIAGNOSIS — R928 Other abnormal and inconclusive findings on diagnostic imaging of breast: Secondary | ICD-10-CM

## 2018-12-15 ENCOUNTER — Other Ambulatory Visit: Payer: Self-pay

## 2018-12-15 ENCOUNTER — Encounter (HOSPITAL_COMMUNITY): Payer: Self-pay | Admitting: *Deleted

## 2018-12-15 ENCOUNTER — Ambulatory Visit (INDEPENDENT_AMBULATORY_CARE_PROVIDER_SITE_OTHER): Payer: BLUE CROSS/BLUE SHIELD

## 2018-12-15 ENCOUNTER — Ambulatory Visit (HOSPITAL_COMMUNITY)
Admission: EM | Admit: 2018-12-15 | Discharge: 2018-12-15 | Disposition: A | Discharge status: Home / Self Care | Payer: BLUE CROSS/BLUE SHIELD | Attending: Family Medicine | Admitting: Family Medicine

## 2018-12-15 DIAGNOSIS — R059 Cough, unspecified: Secondary | ICD-10-CM

## 2018-12-15 DIAGNOSIS — R05 Cough: Secondary | ICD-10-CM

## 2018-12-15 DIAGNOSIS — R062 Wheezing: Secondary | ICD-10-CM

## 2018-12-15 MED ORDER — BENZONATATE 100 MG PO CAPS: 100.0000 mg | ORAL_CAPSULE | Freq: Three times a day (TID) | ORAL | 0 refills | Status: DC

## 2018-12-15 MED ORDER — OSELTAMIVIR PHOSPHATE 75 MG PO CAPS: 75.0000 mg | ORAL_CAPSULE | Freq: Two times a day (BID) | ORAL | 0 refills | Status: DC

## 2018-12-15 MED ORDER — PREDNISONE 10 MG PO TABS: 40.0000 mg | ORAL_TABLET | Freq: Every day | ORAL | 0 refills | Status: AC

## 2018-12-15 NOTE — Discharge Instructions (Addendum)
Continue to push fluids and take over the counter medications as directed on the back of the box for symptomatic relief.  ° °

## 2018-12-15 NOTE — ED Provider Notes (Signed)
[facility]    CSN: [id number] Arrival date & time: 12/15/18  1319     History   Chief Complaint Chief Complaint  Patient presents with  . Appointment    1:30pm  . Cough    HPI Sarah Beasley is a 58 y.o. female.   58 y.o. female presents with nonproductive cough, wheezing,  And fever of 101.2 X 3 days. Condition is acute in nature. Condition is made better by nothing. Condition is made worse at night and when laying down. Patient denies any relief from advil and unisym taken prior to there arrival at this facility.  Patient reports coworkers with similar signs and symptoms.  Patient denies any headaches,upper respiratory congestion, nausea vomiting diarrhea.     After discussion with patient regarding results of x-ray.  Patient is requesting Tamiflu as she is leaving the country and feels this may be precautionary.     Past Medical History:  Diagnosis Date  . Heart murmur   . Hypertension     Patient Active Problem List   Diagnosis Date Noted  . Hematuria 07/07/2017    Past Surgical History:  Procedure Laterality Date  . KNEE SURGERY Left     OB History   No obstetric history on file.      Home Medications    Prior to Admission medications   Medication Sig Start Date End Date Taking? Authorizing Provider  Ibuprofen (ADVIL PO) Take by mouth.   Yes [provider]  typhoid (VIVOTIF) DR capsule Take 1 capsule by mouth every other day. 01/04/18   [name], MD    Family History Family History  Problem Relation Age of Onset  . Hypertension Mother   . Cancer Mother   . Cancer Father   . Diabetes Father   . Heart disease Father   . Hypertension Father   . Hypertension Sister   . Cancer Brother   . Diabetes Brother   . Hypertension Brother   . Hypertension Sister   . Breast cancer Neg Hx     Social History Social History   Tobacco Use  . Smoking status: Never Smoker  . Smokeless tobacco: Never Used  Substance  Use Topics  . Alcohol use: Yes    Alcohol/week: 5.0 standard drinks    Types: 5 Standard drinks or equivalent per week  . Drug use: No     Allergies   Patient has no known allergies.   Review of Systems Review of Systems  Constitutional: Positive for fever. Negative for chills.  HENT: Negative for ear pain and sore throat.   Eyes: Negative for pain and visual disturbance.  Respiratory: Positive for cough ( non productive) and wheezing. Negative for shortness of breath.   Cardiovascular: Negative for chest pain and palpitations.  Gastrointestinal: Negative for abdominal pain and vomiting.  Genitourinary: Negative for dysuria and hematuria.  Musculoskeletal: Negative for arthralgias and back pain.  Skin: Negative for color change and rash.  Neurological: Negative for seizures and syncope.  All other systems reviewed and are negative.    Physical Exam Triage Vital Signs ED Triage Vitals [12/15/18 1350]  Enc Vitals Group     BP (!) 160/95     Pulse Rate 72     Resp 16     Temp 99.2 F (37.3 C)     Temp Source Oral     SpO2 97 %     Weight      Height  Head Circumference      Peak Flow      Pain Score      Pain Loc      Pain Edu?      Excl. in [place]?    No data found.  Updated Vital Signs BP (!) 160/95   Pulse 72   Temp 99.2 F (37.3 C) (Oral)   Resp 16   SpO2 97%   Visual Acuity Right Eye Distance:   Left Eye Distance:   Bilateral Distance:    Right Eye Near:   Left Eye Near:    Bilateral Near:     Physical Exam Vitals signs and nursing note reviewed.  Constitutional:      Appearance: She is well-developed.  HENT:     Head: Normocephalic and atraumatic.  Eyes:     Conjunctiva/sclera: Conjunctivae normal.  Neck:     Musculoskeletal: Normal range of motion.  Cardiovascular:     Rate and Rhythm: Regular rhythm.  Pulmonary:     Effort: Pulmonary effort is normal.     Breath sounds: Normal breath sounds.  Musculoskeletal: Normal range of  motion.  Skin:    General: Skin is warm.  Neurological:     Mental Status: She is alert and oriented to person, place, and time.      UC Treatments / Results  Labs (all labs ordered are listed, but only abnormal results are displayed) Labs Reviewed - No data to display  EKG None  Radiology No results found.  Procedures Procedures (including critical care time)  Medications Ordered in UC Medications - No data to display  Initial Impression / Assessment and Plan / UC Course  I have reviewed the triage vital signs and the nursing notes.  Pertinent labs & imaging results that were available during my care of the patient were reviewed by me and considered in my medical decision making (see chart for details).      Final Clinical Impressions(s) / UC Diagnoses   Final diagnoses:  None   Discharge Instructions   None    ED Prescriptions    None     Controlled Substance Prescriptions [place] Controlled Substance Registry consulted? Not Applicable   Jacqualine Mau, NP 12/15/18 1925

## 2018-12-15 NOTE — ED Triage Notes (Signed)
C/O cough, fevers up to 101.2, "gurgling sound" when coughing over past 3 days.

## 2019-08-12 IMAGING — MG DIGITAL SCREENING BILATERAL MAMMOGRAM WITH TOMO AND CAD
8 series · 9 of 24 positions shown · non-contrast
Comparison: Previous exam(s).

CLINICAL DATA: Screening.

EXAM:
DIGITAL SCREENING BILATERAL MAMMOGRAM WITH TOMO AND CAD

[R CC synth-2D]
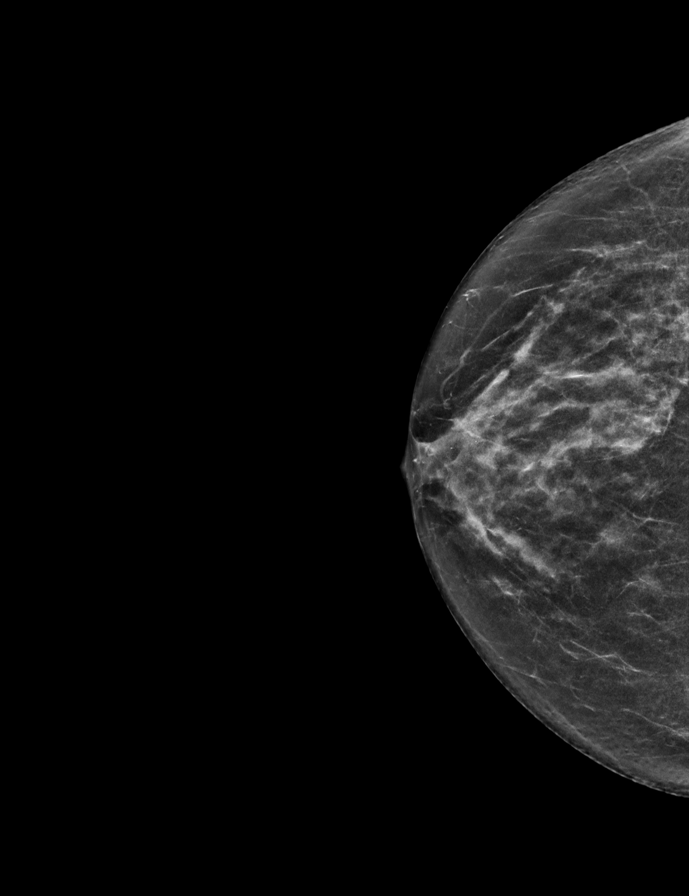

[L MLO synth-2D]
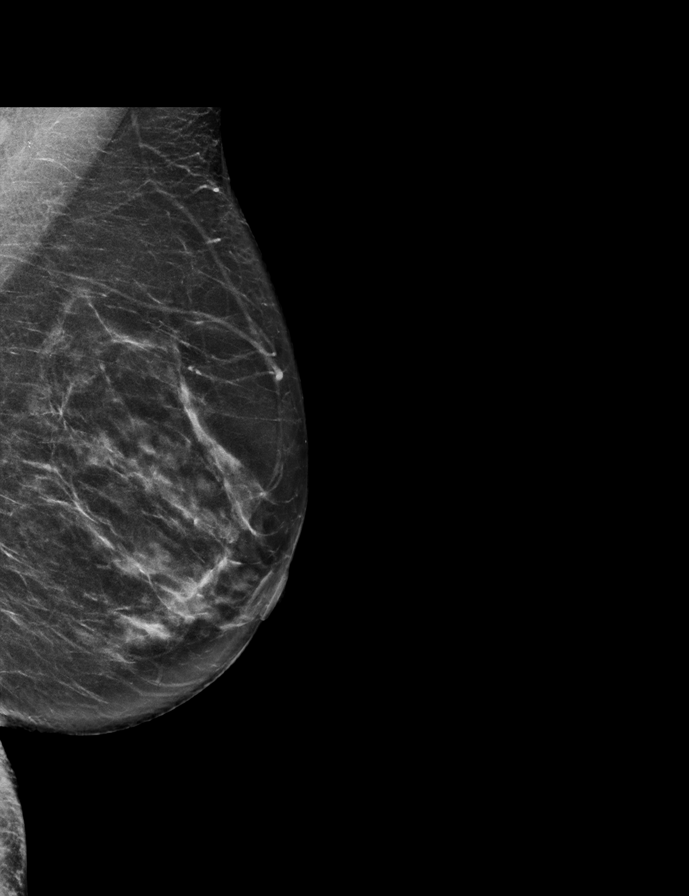

[R MLO synth-2D]
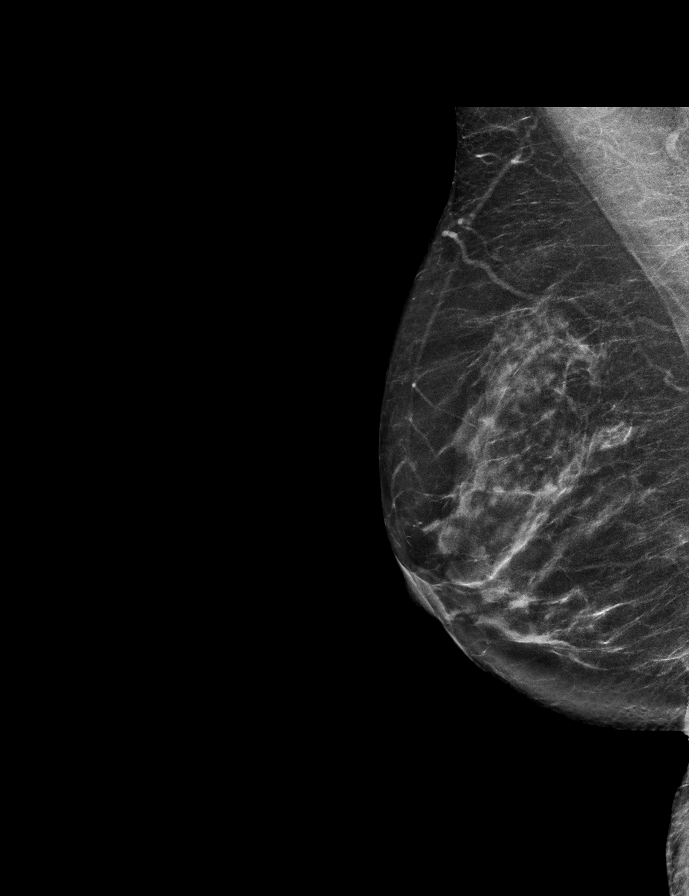

[L CC synth-2D]
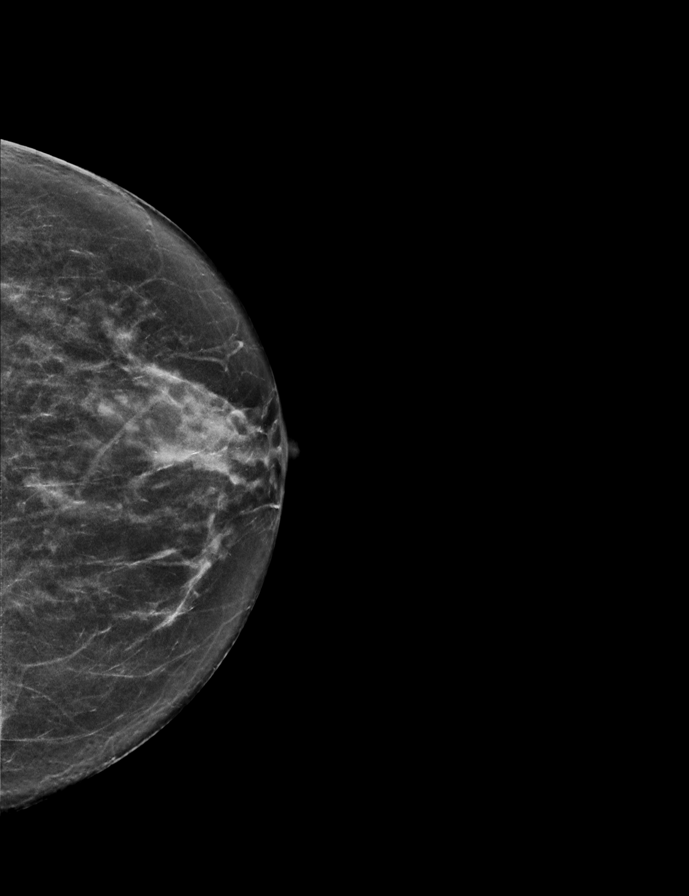

[R CC tomo · 2 of 59 frames shown]
[frame 20/59]
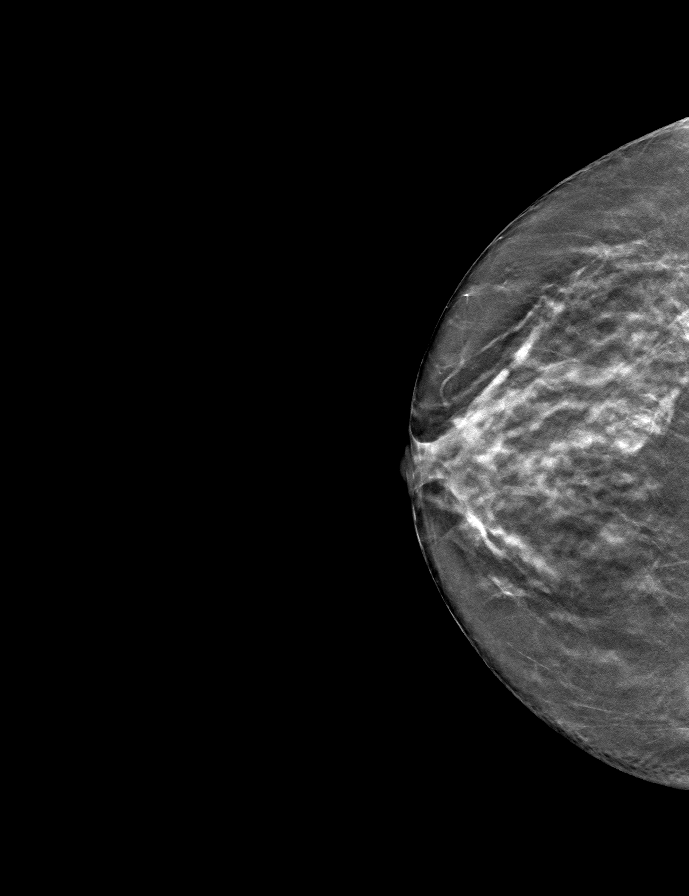
[frame 30/59]
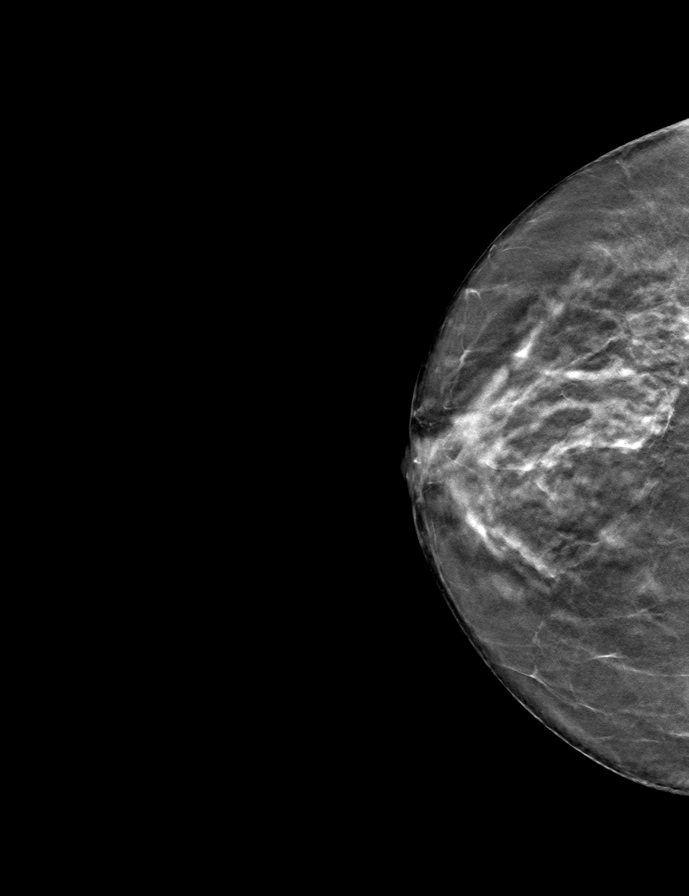

[L CC tomo · tomo slice 30/59.0]
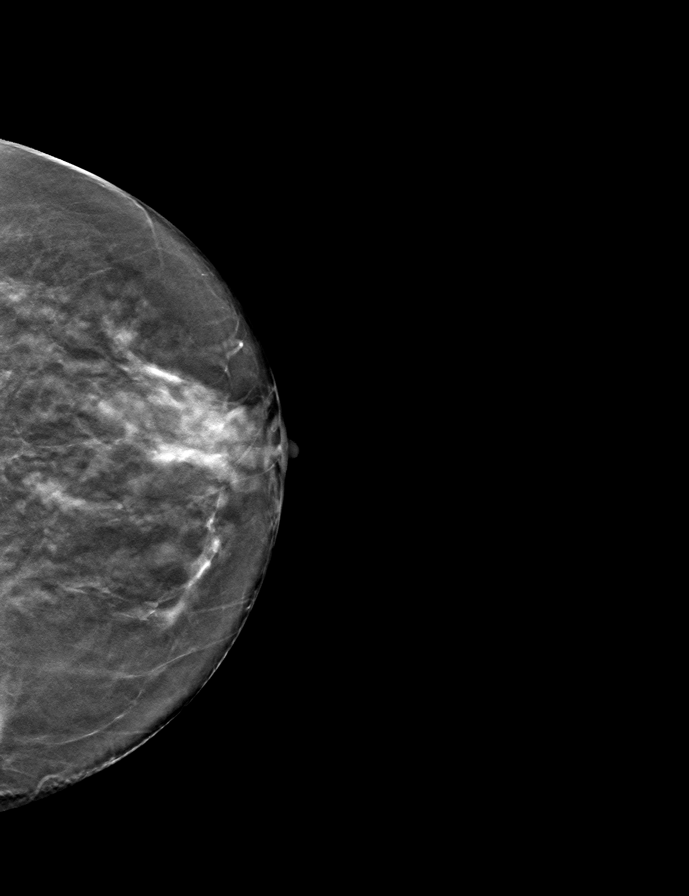

[L MLO tomo · tomo slice 35/68.0]
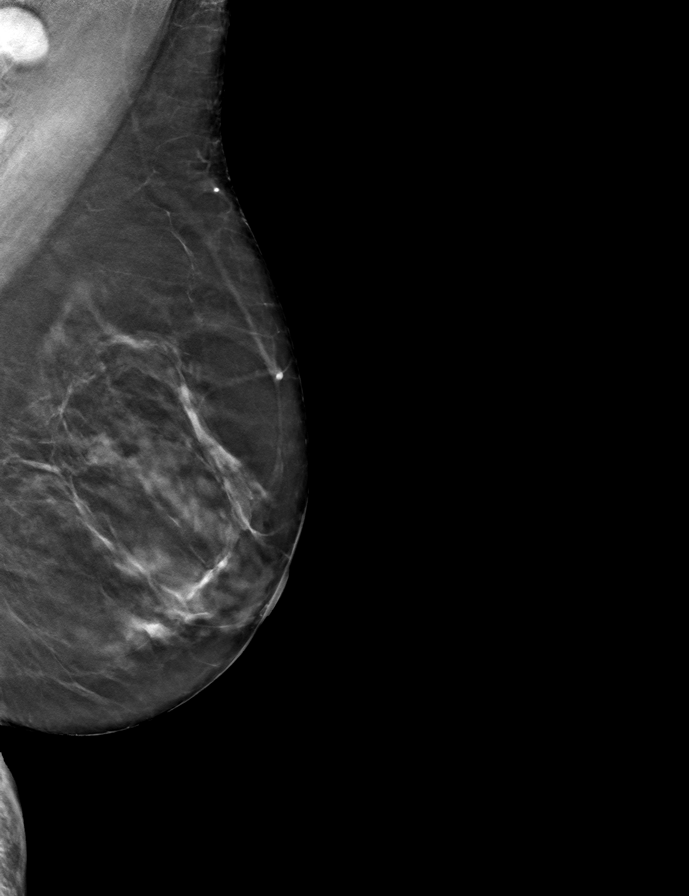

[R MLO tomo · tomo slice 33/64.0]
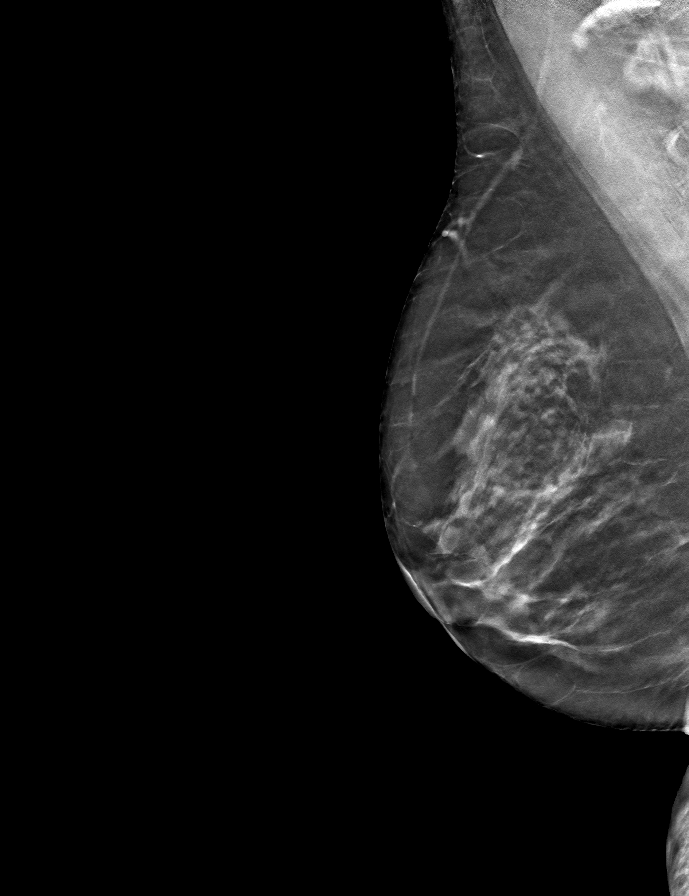

[9 of 24 positions shown; findings below may reference images not displayed]

ACR Breast Density Category c: The breast tissue is heterogeneously
dense, which may obscure small masses.
FINDINGS: In the left breast, a possible abnormal left axillary lymph node
warrants further evaluation. In the right breast, no findings
suspicious for malignancy.

Images were processed with CAD.
IMPRESSION: Further evaluation is suggested for possible abnormal left axillary
lymph node.

RECOMMENDATION:
Ultrasound of the left axilla.  (Code:83-2-LLS)

The patient will be contacted regarding the findings, and additional
imaging will be scheduled.

BI-RADS CATEGORY  0: Incomplete. Need additional imaging evaluation
and/or prior mammograms for comparison.

## 2019-09-02 ENCOUNTER — Other Ambulatory Visit: Payer: Self-pay | Admitting: Family Medicine

## 2019-09-02 DIAGNOSIS — Z1231 Encounter for screening mammogram for malignant neoplasm of breast: Secondary | ICD-10-CM

## 2019-10-22 ENCOUNTER — Ambulatory Visit
Admission: RE | Admit: 2019-10-22 | Discharge: 2019-10-22 | Disposition: A | Discharge status: Home / Self Care | Payer: BLUE CROSS/BLUE SHIELD | Source: Ambulatory Visit | Attending: Family Medicine | Admitting: Family Medicine

## 2019-10-22 ENCOUNTER — Other Ambulatory Visit: Payer: Self-pay

## 2019-10-22 DIAGNOSIS — Z1231 Encounter for screening mammogram for malignant neoplasm of breast: Secondary | ICD-10-CM

## 2019-11-08 IMAGING — DX DG CHEST 2V
2 series · 2 of 2 positions shown · non-contrast
Comparison: None.

CLINICAL DATA: Cough and wheezing

EXAM:
CHEST - 2 VIEW

[chest pa]
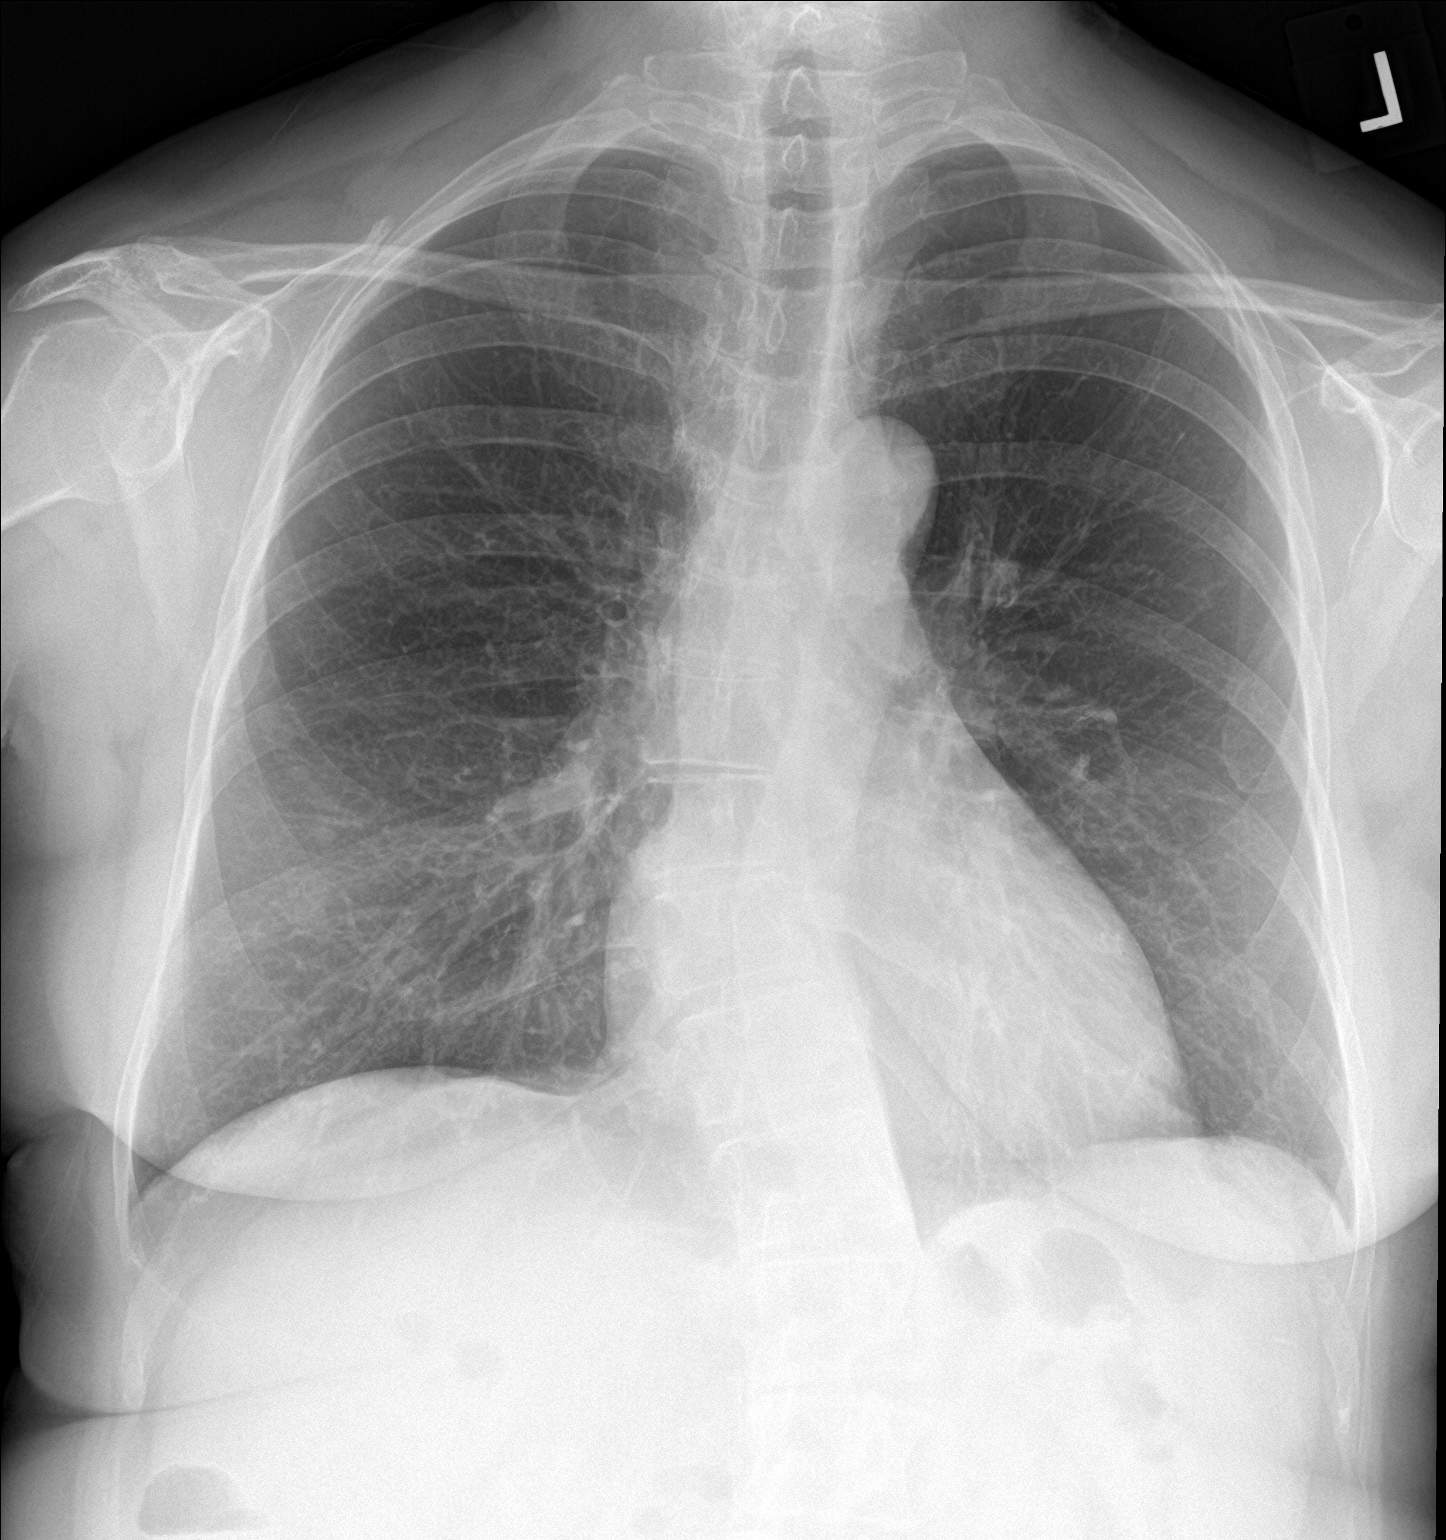

[chest lat]
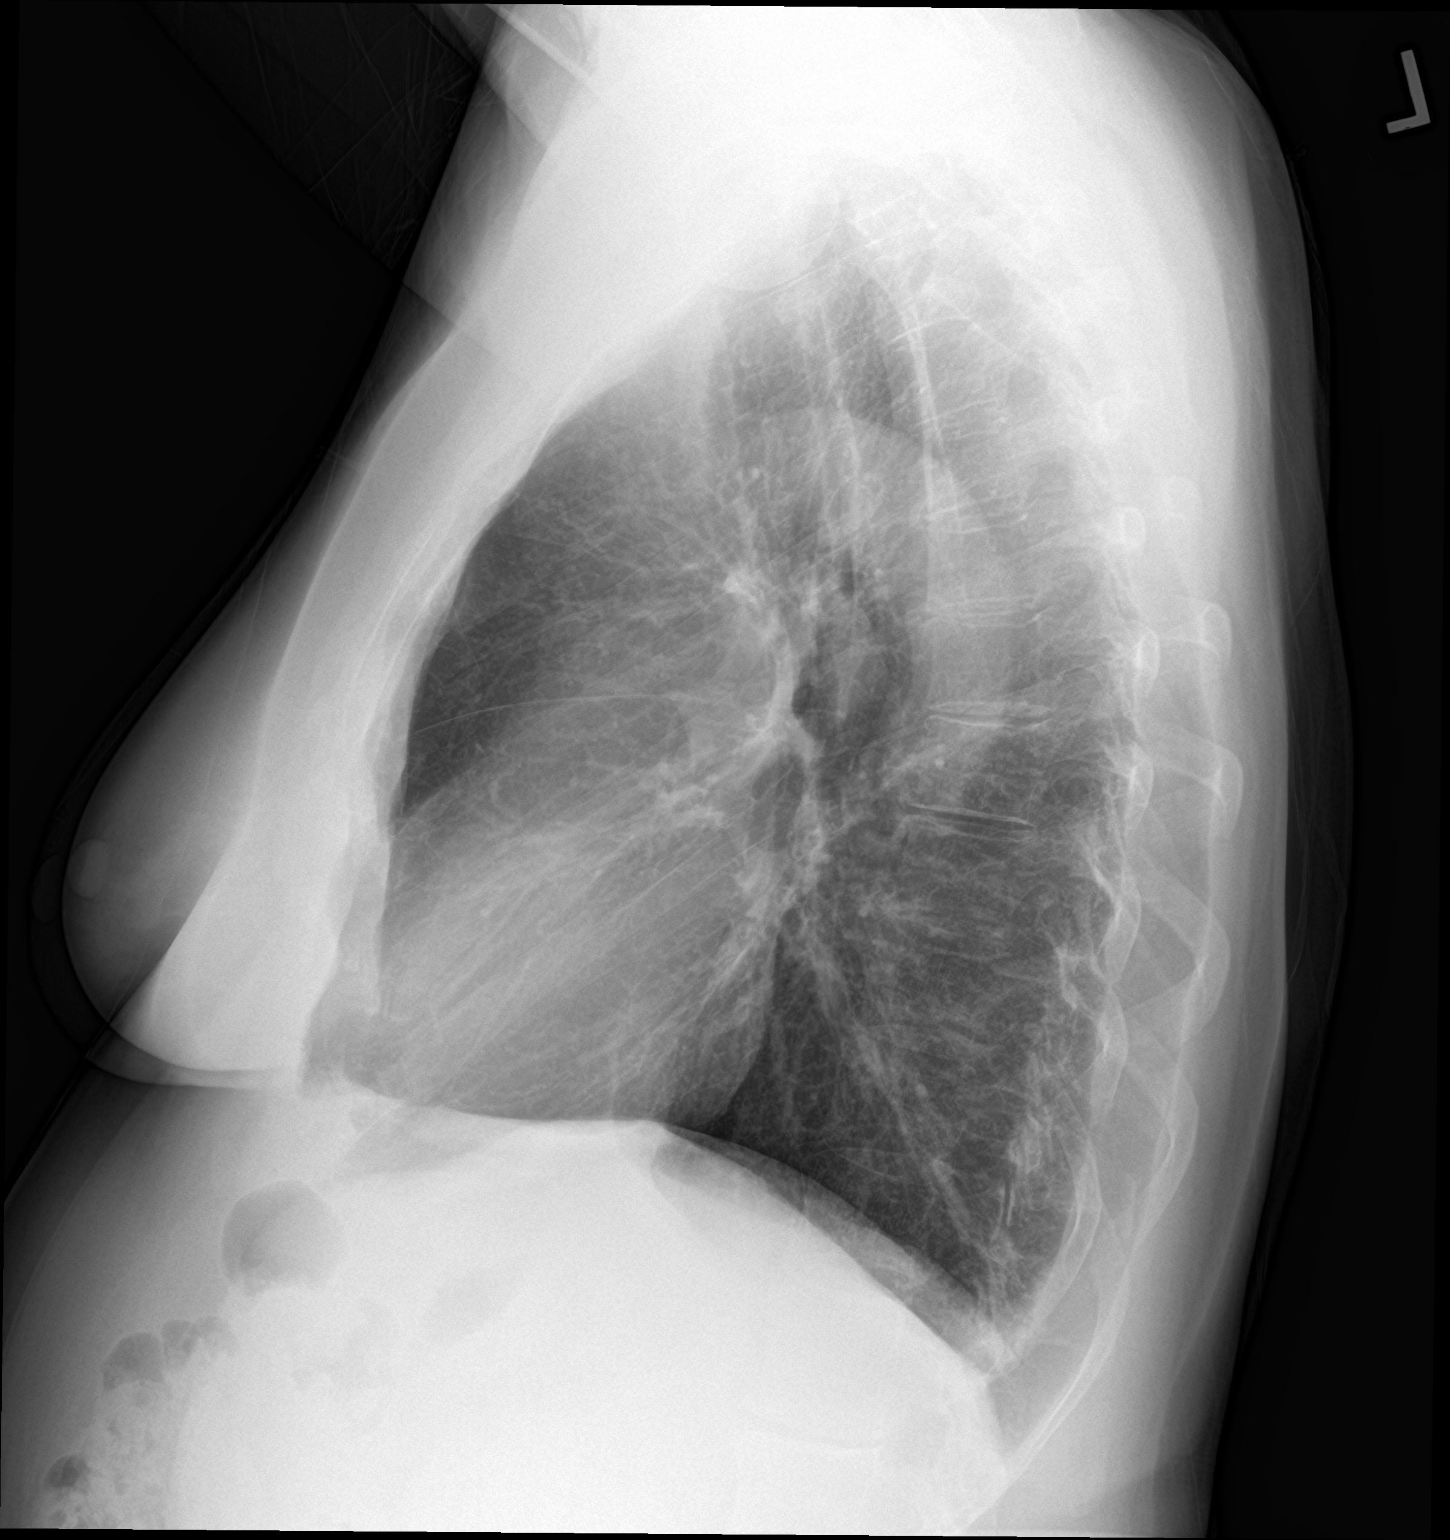

[2 of 2 positions shown; findings below may reference images not displayed]

FINDINGS: Normal cardiac and mediastinal contours. No consolidative pulmonary
opacities. No pleural effusion or pneumothorax. Thoracic spine
degenerative changes.
IMPRESSION: No acute cardiopulmonary process.

## 2020-09-07 ENCOUNTER — Telehealth: Payer: Self-pay | Admitting: Family Medicine

## 2020-09-07 NOTE — Telephone Encounter (Signed)
Patient is calling to reschedule her CPE Please advise Cb- [phone]

## 2020-09-07 NOTE — Telephone Encounter (Signed)
Pt thought her appt was on 11/8 but informed her that her appt is 11/11 pt wanted to keep appt as is. Please advise.

## 2020-09-10 ENCOUNTER — Other Ambulatory Visit: Payer: Self-pay | Admitting: Family Medicine

## 2020-09-10 DIAGNOSIS — Z1231 Encounter for screening mammogram for malignant neoplasm of breast: Secondary | ICD-10-CM

## 2020-09-14 ENCOUNTER — Ambulatory Visit: Payer: BLUE CROSS/BLUE SHIELD | Admitting: Family Medicine

## 2020-09-17 ENCOUNTER — Encounter: Payer: Self-pay | Admitting: Family Medicine

## 2020-09-17 ENCOUNTER — Other Ambulatory Visit: Payer: Self-pay

## 2020-09-17 ENCOUNTER — Ambulatory Visit (INDEPENDENT_AMBULATORY_CARE_PROVIDER_SITE_OTHER): Payer: 59 | Admitting: Family Medicine

## 2020-09-17 VITALS — BP 148/92 | HR 71 | Temp 97.3°F | Ht 60.0 in | Wt 142.0 lb

## 2020-09-17 DIAGNOSIS — Z0000 Encounter for general adult medical examination without abnormal findings: Secondary | ICD-10-CM

## 2020-09-17 DIAGNOSIS — Z131 Encounter for screening for diabetes mellitus: Secondary | ICD-10-CM

## 2020-09-17 DIAGNOSIS — Z862 Personal history of diseases of the blood and blood-forming organs and certain disorders involving the immune mechanism: Secondary | ICD-10-CM

## 2020-09-17 DIAGNOSIS — I10 Essential (primary) hypertension: Secondary | ICD-10-CM

## 2020-09-17 DIAGNOSIS — Z23 Encounter for immunization: Secondary | ICD-10-CM

## 2020-09-17 DIAGNOSIS — Z0001 Encounter for general adult medical examination with abnormal findings: Secondary | ICD-10-CM

## 2020-09-17 DIAGNOSIS — H539 Unspecified visual disturbance: Secondary | ICD-10-CM | POA: Diagnosis not present

## 2020-09-17 DIAGNOSIS — R3129 Other microscopic hematuria: Secondary | ICD-10-CM | POA: Diagnosis not present

## 2020-09-17 DIAGNOSIS — Z1211 Encounter for screening for malignant neoplasm of colon: Secondary | ICD-10-CM

## 2020-09-17 DIAGNOSIS — Z124 Encounter for screening for malignant neoplasm of cervix: Secondary | ICD-10-CM

## 2020-09-17 DIAGNOSIS — Z13220 Encounter for screening for lipoid disorders: Secondary | ICD-10-CM

## 2020-09-17 DIAGNOSIS — Z1159 Encounter for screening for other viral diseases: Secondary | ICD-10-CM

## 2020-09-17 DIAGNOSIS — Z114 Encounter for screening for human immunodeficiency virus [HIV]: Secondary | ICD-10-CM

## 2020-09-17 LAB — POCT URINALYSIS DIP (MANUAL ENTRY)
Bilirubin, UA: NEGATIVE
Glucose, UA: NEGATIVE mg/dL
Ketones, POC UA: NEGATIVE mg/dL
Leukocytes, UA: NEGATIVE
Nitrite, UA: NEGATIVE
Protein Ur, POC: NEGATIVE mg/dL
Spec Grav, UA: 1.01 (ref 1.010–1.025)
Urobilinogen, UA: 0.2 E.U./dL
pH, UA: 5.5 (ref 5.0–8.0)

## 2020-09-17 MED ORDER — LOSARTAN POTASSIUM 50 MG PO TABS: 25.0000 mg | ORAL_TABLET | Freq: Every day | ORAL | 1 refills | Status: DC

## 2020-09-17 NOTE — Patient Instructions (Addendum)
Ask your eye specialist about nighttime eye symptoms. I can refer you to ophthalmology if needed as well.   I will check labs, referred you for colonoscopy and OBGYN.  Shingles vaccine today, repeat in 6 months. Keep a record of your blood pressures outside of the office, and recheck with those readings in the next 1 month. Start 1/2 losartan daily for now  Try to minimize any additional sodium in the diet or processed foods with sodium to see if that helps in the meantime as well.  Return to the clinic or go to the nearest emergency room if any of your symptoms worsen or new symptoms occur.  Keeping You Healthy  Get These Tests  Blood Pressure- Have your blood pressure checked by your healthcare provider at least once a year.  Normal blood pressure is 120/80.  Weight- Have your body mass index (BMI) calculated to screen for obesity.  BMI is a measure of body fat based on height and weight.  You can calculate your own BMI at [employer]  Cholesterol- Have your cholesterol checked every year.  Diabetes- Have your blood sugar checked every year if you have high blood pressure, high cholesterol, a family history of diabetes or if you are overweight.  Pap Test - Have a pap test every 1 to 5 years if you have been sexually active.  If you are older than 65 and recent pap tests have been normal you may not need additional pap tests.  In addition, if you have had a hysterectomy  for benign disease additional pap tests are not necessary.  Mammogram-Yearly mammograms are essential for early detection of breast cancer  Screening for Colon Cancer- Colonoscopy starting at age 42. Screening may begin sooner depending on your family history and other health conditions.  Follow up colonoscopy as directed by your Gastroenterologist.  Screening for Osteoporosis- Screening begins at age 58 with bone density scanning, sooner if you are at higher risk for developing Osteoporosis.  Get these  medicines  Calcium with Vitamin D- Your body requires 1200-1500 mg of Calcium a day and [phone] IU of Vitamin D a day.  You can only absorb 500 mg of Calcium at a time therefore Calcium must be taken in 2 or 3 separate doses throughout the day.  Hormones- Hormone therapy has been associated with increased risk for certain cancers and heart disease.  Talk to your healthcare provider about if you need relief from menopausal symptoms.  Aspirin- Ask your healthcare provider about taking Aspirin to prevent Heart Disease and Stroke.  Get these Immuniztions  Flu shot- Every fall  Pneumonia shot- Once after the age of 29; if you are younger ask your healthcare provider if you need a pneumonia shot.  Tetanus- Every ten years.  Vaccine for shingles today.   Take these steps  Don't smoke- Your healthcare provider can help you quit. For tips on how to quit, ask your healthcare provider or go to www.smokefree.gov or call 1-800 QUIT-NOW.  Be physically active- Exercise 5 days a week for a minimum of 30 minutes.  If you are not already physically active, start slow and gradually work up to 30 minutes of moderate physical activity.  Try walking, dancing, bike riding, swimming, etc.  Eat a healthy diet- Eat a variety of healthy foods such as fruits, vegetables, whole grains, low fat milk, low fat cheeses, yogurt, lean meats, chicken, fish, eggs, dried beans, tofu, etc.  For more information go to www.thenutritionsource.org  Dental visit- Brush and  floss teeth twice daily; visit your dentist twice a year.  Eye exam- Visit your Optometrist or Ophthalmologist yearly.  Drink alcohol in moderation- Limit alcohol intake to one drink or less a day.  Never drink and drive.  Depression- Your emotional health is as important as your physical health.  If you're feeling down or losing interest in things you normally enjoy, please talk to your healthcare provider.  Seat Belts- can save your life; always wear  one  Smoke/Carbon Monoxide detectors- These detectors need to be installed on the appropriate level of your home.  Replace batteries at least once a year.  Violence- If anyone is threatening or hurting you, please tell your healthcare provider.  Living Will/ Health care power of attorney- Discuss with your healthcare provider and family.  If you have lab work done today you will be contacted with your lab results within the next 2 weeks.  If you have not heard from [place] then please contact us. The fastest way to get your results is to register for My Chart.   IF you received an x-ray today, you will receive an invoice from [facility]. Please contact [facility] at [phone] with questions or concerns regarding your invoice.   IF you received labwork today, you will receive an invoice from [facility]. Please contact [facility] at [phone] with questions or concerns regarding your invoice.   Our billing staff will not be able to assist you with questions regarding bills from these companies.  You will be contacted with the lab results as soon as they are available. The fastest way to get your results is to activate your My Chart account. Instructions are located on the last page of this paperwork. If you have not heard from [place] regarding the results in 2 weeks, please contact this office.

## 2020-09-17 NOTE — Progress Notes (Signed)
Subjective:  Patient ID: Sarah Beasley, female    DOB: February 21, 1961  Age: 59 y.o. MRN: [id number]  CC:  Chief Complaint  Patient presents with  . Annual Exam    pt reports as far as her general health she feels fine, but pt has noticed when she gos to the bathroom at night that as she is moving back to her bed she is blind in her L eye, but after the laying down the vision seems to return. this seems to only happen at night.    HPI Sarah Beasley presents for  Annual physical exam with other concerns as above.  Transient loss of left vision.: On and off for 6 months.  Notices after using the restroom in the middle the night, symptoms return after laying back down. Happens intermittently. Mostly dark vision in left eye, feel like dark shade coming down, uses bathroom, then after lying down vision improves in a minute or two. No daytime symptoms. No new floaters or daytime change. Usually sleeps on left side.  Optometrist- had visit last week.pressure ok, dilated eye exam - told was ok. Slight astigmatism. Did not discuss these symptoms.   History of hematuria Discussed in 2018, referred to urology.  Negative micro at that time, recommended annual urinalysis with PCP. No gross hematuria.  History of anemia Discussed in 2018.  Hemoglobin 11.5 in May 2018, repeat testing was normal Mar 30, 2017 with hemoglobin 12.9.   Hypertension: History of hypertension, previously treated with losartan 50 mg daily.  Has measured home readings and initially started elevated and improved towards normal - 120/80's. readings have been elevated in the emergency room in November 2019 and February 2020.   BP Readings from Last 3 Encounters:  09/17/20 (!) 148/92  12/15/18 (!) 160/95  09/10/18 (!) 178/110   Lab Results  Component Value Date   CREATININE 0.60 03/10/2017      Cancer screening Colon: Screening options with colonoscopy versus Cologuard discussed. Discussed timing of repeat  testing intervals if normal, as well as potential need for diagnostic Colonoscopy if positive Cologuard. Understanding expressed, and chose colonoscopy.  Pap testing: requests referral to OBGYN. Last pap 5 years ago before moved here. Unknown if HPV testing.  Mammogram 10/22/2019, scheduled 10/23/20.   Immunization History  Administered Date(s) Administered  . Hepatitis A, Adult 01/04/2018  . Influenza Inj Mdck Quad Pf 08/27/2019  . Influenza,inj,Quad PF,6+ Mos 09/24/2015, 01/04/2018, 12/05/2018  . Influenza-Unspecified 09/02/2020  . Rabies, IM 09/10/2018, 09/13/2018, 09/17/2018, 09/24/2018  . Tdap 04/14/2016  . Typhoid Live 01/04/2018  . Zoster Recombinat (Shingrix) 09/17/2020  covid vaccine and booster done last week.  Shingles: 1st one today.   Depression screen [facility] 2/9 09/17/2020 01/04/2018 12/20/2017 03/30/2017 03/10/2017  Decreased Interest 0 0 0 0 0  Down, Depressed, Hopeless 0 0 0 0 0  PHQ - 2 Score 0 0 0 0 0    Hearing Screening   125Hz  250Hz  500Hz  1000Hz  2000Hz  3000Hz  4000Hz  6000Hz  8000Hz   Right ear:           Left ear:             Visual Acuity Screening   Right eye Left eye Both eyes  Without correction:     With correction: 20/25 20/30 20/20    Dental:  Exercise:    History Patient Active Problem List   Diagnosis Date Noted  . Hematuria 07/07/2017   Past Medical History:  Diagnosis Date  . Heart murmur   . Hypertension  Past Surgical History:  Procedure Laterality Date  . KNEE SURGERY Left    No Known Allergies Prior to Admission medications   Medication Sig Start Date End Date Taking? Authorizing Provider  Ibuprofen (ADVIL PO) Take by mouth.   Yes [provider]  oseltamivir (TAMIFLU) 75 MG capsule Take 1 capsule (75 mg total) by mouth every 12 (twelve) hours. Patient not taking: Reported on 09/17/2020 12/15/18   Jacqualine Mau, NP  typhoid (VIVOTIF) DR capsule Take 1 capsule by mouth every other day. Patient not taking: Reported on  09/17/2020 01/04/18   [name], MD   Social History   Socioeconomic History  . Marital status: Single    Spouse name: Not on file  . Number of children: 0  . Years of education: BA  . Highest education level: Not on file  Occupational History  . Not on file  Tobacco Use  . Smoking status: Never Smoker  . Smokeless tobacco: Never Used  Vaping Use  . Vaping Use: Never used  Substance and Sexual Activity  . Alcohol use: Yes    Alcohol/week: 10.0 standard drinks    Types: 10 Glasses of wine per week    Comment: 10 glasses through out the week  . Drug use: No  . Sexual activity: Not Currently  Other Topics Concern  . Not on file  Social History Narrative   Exercise walking for 1 hour   Drinks 1 cup of coffee in the morning.    Social Determinants of Health   Financial Resource Strain:   . Difficulty of Paying Living Expenses: Not on file  Food Insecurity:   . Worried About Charity fundraiser in the Last Year: Not on file  . Ran Out of Food in the Last Year: Not on file  Transportation Needs:   . Lack of Transportation (Medical): Not on file  . Lack of Transportation (Non-Medical): Not on file  Physical Activity:   . Days of Exercise per Week: Not on file  . Minutes of Exercise per Session: Not on file  Stress:   . Feeling of Stress : Not on file  Social Connections:   . Frequency of Communication with Friends and Family: Not on file  . Frequency of Social Gatherings with Friends and Family: Not on file  . Attends Religious Services: Not on file  . Active Member of Clubs or Organizations: Not on file  . Attends Archivist Meetings: Not on file  . Marital Status: Not on file  Intimate Partner Violence:   . Fear of Current or Ex-Partner: Not on file  . Emotionally Abused: Not on file  . Physically Abused: Not on file  . Sexually Abused: Not on file    Review of Systems   Objective:   Vitals:   09/17/20 1040 09/17/20 1153 09/17/20 1154  BP:  (!) 157/85 (!) 146/88 (!) 148/92  Pulse: 71    Temp: (!) 97.3 F (36.3 C)    TempSrc: Temporal    SpO2: 100%    Weight: 142 lb (64.4 kg)    Height: 5' (1.524 m)       Physical Exam Constitutional:      Appearance: She is well-developed.  HENT:     Head: Normocephalic and atraumatic.     Right Ear: External ear normal.     Left Ear: External ear normal.  Eyes:     Conjunctiva/sclera: Conjunctivae normal.     Pupils: Pupils are equal, round, and  reactive to light.  Neck:     Thyroid: No thyromegaly.  Cardiovascular:     Rate and Rhythm: Normal rate and regular rhythm.     Heart sounds: Normal heart sounds. No murmur heard.   Pulmonary:     Effort: Pulmonary effort is normal. No respiratory distress.     Breath sounds: Normal breath sounds. No wheezing.  Abdominal:     General: Bowel sounds are normal.     Palpations: Abdomen is soft.     Tenderness: There is no abdominal tenderness.  Musculoskeletal:        General: No tenderness. Normal range of motion.     Cervical back: Normal range of motion and neck supple.  Lymphadenopathy:     Cervical: No cervical adenopathy.  Skin:    General: Skin is warm and dry.     Findings: No rash.  Neurological:     Mental Status: She is alert and oriented to person, place, and time.  Psychiatric:        Behavior: Behavior normal.        Thought Content: Thought content normal.    Results for orders placed or performed in visit on 09/17/20  POCT urinalysis dipstick  Result Value Ref Range   Color, UA yellow yellow   Clarity, UA clear clear   Glucose, UA negative negative mg/dL   Bilirubin, UA negative negative   Ketones, POC UA negative negative mg/dL   Spec Grav, UA 1.010 1.010 - 1.025   Blood, UA small (A) negative   pH, UA 5.5 5.0 - 8.0   Protein Ur, POC negative negative mg/dL   Urobilinogen, UA 0.2 0.2 or 1.0 E.U./dL   Nitrite, UA Negative Negative   Leukocytes, UA Negative Negative       Assessment & Plan:    Sarah Beasley is a 59 y.o. female . Annual physical exam  - -anticipatory guidance as below in AVS, screening labs above. Health maintenance items as above in HPI discussed/recommended as applicable.   Screening for hyperlipidemia - Plan: Lipid panel, Comprehensive metabolic panel  Screening for diabetes mellitus - Plan: Hemoglobin A1c  Special screening for malignant neoplasms, colon - Plan: Ambulatory referral to Gastroenterology  Screening for cervical cancer - Plan: Ambulatory referral to Obstetrics / Gynecology, Ambulatory referral to Gastroenterology  Screening for HIV without presence of risk factors - Plan: HIV antibody (with reflex)  Encounter for hepatitis C screening test for low risk patient - Plan: Hepatitis C antibody  History of anemia - Plan: CBC  Microscopic hematuria - Plan: POCT urinalysis dipstick, Urine Microscopic  -Small blood in office urinalysis, send out micro.  If persistent microscopic hematuria, recommend follow back up with urology.  Transient vision disturbance of left eye  -Symptoms only noted at night and reports recent normal exam through her optometrist.  Recommended discussing the symptoms with optometry as may need other testing.  Less likely concerning without daytime symptoms.  Essential hypertension - Plan: losartan (COZAAR) 50 MG tablet  -Persistent elevated readings, will start low-dose losartan 25 mg daily, recheck in the next 1 month.  Decrease sodium in diet.  RTC/ER precautions given.  Need for shingles vaccine - Plan: Varicella-zoster vaccine IM   Meds ordered this encounter  Medications  . losartan (COZAAR) 50 MG tablet    Sig: Take 0.5 tablets (25 mg total) by mouth daily.    Dispense:  45 tablet    Refill:  1   Patient Instructions   Ask your eye  specialist about nighttime eye symptoms. I can refer you to ophthalmology if needed as well.   I will check labs, referred you for colonoscopy and OBGYN.  Shingles vaccine  today, repeat in 6 months. Keep a record of your blood pressures outside of the office, and recheck with those readings in the next 1 month. Start 1/2 losartan daily for now  Try to minimize any additional sodium in the diet or processed foods with sodium to see if that helps in the meantime as well.  Return to the clinic or go to the nearest emergency room if any of your symptoms worsen or new symptoms occur.  Keeping You Healthy  Get These Tests  Blood Pressure- Have your blood pressure checked by your healthcare provider at least once a year.  Normal blood pressure is 120/80.  Weight- Have your body mass index (BMI) calculated to screen for obesity.  BMI is a measure of body fat based on height and weight.  You can calculate your own BMI at [employer]  Cholesterol- Have your cholesterol checked every year.  Diabetes- Have your blood sugar checked every year if you have high blood pressure, high cholesterol, a family history of diabetes or if you are overweight.  Pap Test - Have a pap test every 1 to 5 years if you have been sexually active.  If you are older than 65 and recent pap tests have been normal you may not need additional pap tests.  In addition, if you have had a hysterectomy  for benign disease additional pap tests are not necessary.  Mammogram-Yearly mammograms are essential for early detection of breast cancer  Screening for Colon Cancer- Colonoscopy starting at age 8. Screening may begin sooner depending on your family history and other health conditions.  Follow up colonoscopy as directed by your Gastroenterologist.  Screening for Osteoporosis- Screening begins at age 70 with bone density scanning, sooner if you are at higher risk for developing Osteoporosis.  Get these medicines  Calcium with Vitamin D- Your body requires 1200-1500 mg of Calcium a day and [phone] IU of Vitamin D a day.  You can only absorb 500 mg of Calcium at a time therefore Calcium  must be taken in 2 or 3 separate doses throughout the day.  Hormones- Hormone therapy has been associated with increased risk for certain cancers and heart disease.  Talk to your healthcare provider about if you need relief from menopausal symptoms.  Aspirin- Ask your healthcare provider about taking Aspirin to prevent Heart Disease and Stroke.  Get these Immuniztions  Flu shot- Every fall  Pneumonia shot- Once after the age of 24; if you are younger ask your healthcare provider if you need a pneumonia shot.  Tetanus- Every ten years.  Vaccine for shingles today.   Take these steps  Don't smoke- Your healthcare provider can help you quit. For tips on how to quit, ask your healthcare provider or go to www.smokefree.gov or call 1-800 QUIT-NOW.  Be physically active- Exercise 5 days a week for a minimum of 30 minutes.  If you are not already physically active, start slow and gradually work up to 30 minutes of moderate physical activity.  Try walking, dancing, bike riding, swimming, etc.  Eat a healthy diet- Eat a variety of healthy foods such as fruits, vegetables, whole grains, low fat milk, low fat cheeses, yogurt, lean meats, chicken, fish, eggs, dried beans, tofu, etc.  For more information go to www.thenutritionsource.org  Dental visit- Brush and floss teeth  twice daily; visit your dentist twice a year.  Eye exam- Visit your Optometrist or Ophthalmologist yearly.  Drink alcohol in moderation- Limit alcohol intake to one drink or less a day.  Never drink and drive.  Depression- Your emotional health is as important as your physical health.  If you're feeling down or losing interest in things you normally enjoy, please talk to your healthcare provider.  Seat Belts- can save your life; always wear one  Smoke/Carbon Monoxide detectors- These detectors need to be installed on the appropriate level of your home.  Replace batteries at least once a year.  Violence- If anyone is  threatening or hurting you, please tell your healthcare provider.  Living Will/ Health care power of attorney- Discuss with your healthcare provider and family.  If you have lab work done today you will be contacted with your lab results within the next 2 weeks.  If you have not heard from [place] then please contact us. The fastest way to get your results is to register for My Chart.   IF you received an x-ray today, you will receive an invoice from [facility]. Please contact [facility] at [phone] with questions or concerns regarding your invoice.   IF you received labwork today, you will receive an invoice from [facility]. Please contact [facility] at [phone] with questions or concerns regarding your invoice.   Our billing staff will not be able to assist you with questions regarding bills from these companies.  You will be contacted with the lab results as soon as they are available. The fastest way to get your results is to activate your My Chart account. Instructions are located on the last page of this paperwork. If you have not heard from [place] regarding the results in 2 weeks, please contact this office.         Signed, Merri Ray, MD [facility]

## 2020-09-18 LAB — URINALYSIS, MICROSCOPIC ONLY
Bacteria, UA: NONE SEEN
Casts: NONE SEEN /lpf
Epithelial Cells (non renal): NONE SEEN /hpf (ref 0–10)
RBC: NONE SEEN /hpf (ref 0–2)
WBC, UA: NONE SEEN /hpf (ref 0–5)

## 2020-09-18 LAB — COMPREHENSIVE METABOLIC PANEL
ALT: 15 IU/L (ref 0–32)
AST: 17 IU/L (ref 0–40)
Albumin/Globulin Ratio: 1.6 (ref 1.2–2.2)
Albumin: 4.2 g/dL (ref 3.8–4.9)
Alkaline Phosphatase: 85 IU/L (ref 44–121)
BUN/Creatinine Ratio: 13 (ref 9–23)
BUN: 8 mg/dL (ref 6–24)
Bilirubin Total: 0.4 mg/dL (ref 0.0–1.2)
CO2: 23 mmol/L (ref 20–29)
Calcium: 9.5 mg/dL (ref 8.7–10.2)
Chloride: 104 mmol/L (ref 96–106)
Creatinine, Ser: 0.62 mg/dL (ref 0.57–1.00)
GFR calc Af Amer: 115 mL/min/{1.73_m2} (ref >59)
GFR calc non Af Amer: 100 mL/min/{1.73_m2} (ref >59)
Globulin, Total: 2.6 g/dL (ref 1.5–4.5)
Glucose: 87 mg/dL (ref 65–99)
Potassium: 4.1 mmol/L (ref 3.5–5.2)
Sodium: 141 mmol/L (ref 134–144)
Total Protein: 6.8 g/dL (ref 6.0–8.5)

## 2020-09-18 LAB — LIPID PANEL
Chol/HDL Ratio: 4.4 ratio (ref 0.0–4.4)
Cholesterol, Total: 283 mg/dL — ABNORMAL HIGH (ref 100–199)
HDL: 65 mg/dL (ref >39)
LDL Chol Calc (NIH): 142 mg/dL — ABNORMAL HIGH (ref 0–99)
Triglycerides: 416 mg/dL — ABNORMAL HIGH (ref 0–149)
VLDL Cholesterol Cal: 76 mg/dL — ABNORMAL HIGH (ref 5–40)

## 2020-09-18 LAB — CBC
Hematocrit: 39.7 % (ref 34.0–46.6)
Hemoglobin: 13.5 g/dL (ref 11.1–15.9)
MCH: 30.5 pg (ref 26.6–33.0)
MCHC: 34 g/dL (ref 31.5–35.7)
MCV: 90 fL (ref 79–97)
Platelets: 256 10*3/uL (ref 150–450)
RBC: 4.42 x10E6/uL (ref 3.77–5.28)
RDW: 12.6 % (ref 11.7–15.4)
WBC: 7.7 10*3/uL (ref 3.4–10.8)

## 2020-09-18 LAB — HEMOGLOBIN A1C
Est. average glucose Bld gHb Est-mCnc: 105 mg/dL
Hgb A1c MFr Bld: 5.3 % (ref 4.8–5.6)

## 2020-09-18 LAB — HIV ANTIBODY (ROUTINE TESTING W REFLEX): HIV Screen 4th Generation wRfx: NONREACTIVE

## 2020-09-18 LAB — HEPATITIS C ANTIBODY: Hep C Virus Ab: <0.1 s/co ratio (ref 0.0–0.9)

## 2020-10-08 DIAGNOSIS — D649 Anemia, unspecified: Secondary | ICD-10-CM

## 2020-10-08 DIAGNOSIS — R011 Cardiac murmur, unspecified: Secondary | ICD-10-CM | POA: Insufficient documentation

## 2020-10-08 HISTORY — DX: Anemia, unspecified: D64.9

## 2020-10-08 LAB — HM MAMMOGRAPHY

## 2020-10-09 LAB — HM PAP SMEAR: HM Pap smear: NORMAL

## 2020-10-19 ENCOUNTER — Encounter: Payer: Self-pay | Admitting: Family Medicine

## 2020-10-19 ENCOUNTER — Ambulatory Visit (INDEPENDENT_AMBULATORY_CARE_PROVIDER_SITE_OTHER): Payer: 59 | Admitting: Family Medicine

## 2020-10-19 ENCOUNTER — Other Ambulatory Visit: Payer: Self-pay

## 2020-10-19 VITALS — BP 149/89 | HR 72 | Temp 98.4°F | Ht 60.0 in | Wt 140.0 lb

## 2020-10-19 DIAGNOSIS — M549 Dorsalgia, unspecified: Secondary | ICD-10-CM | POA: Diagnosis not present

## 2020-10-19 DIAGNOSIS — I499 Cardiac arrhythmia, unspecified: Secondary | ICD-10-CM | POA: Diagnosis not present

## 2020-10-19 DIAGNOSIS — G4733 Obstructive sleep apnea (adult) (pediatric): Secondary | ICD-10-CM | POA: Diagnosis not present

## 2020-10-19 DIAGNOSIS — I10 Essential (primary) hypertension: Secondary | ICD-10-CM

## 2020-10-19 DIAGNOSIS — Z8349 Family history of other endocrine, nutritional and metabolic diseases: Secondary | ICD-10-CM

## 2020-10-19 MED ORDER — LOSARTAN POTASSIUM 50 MG PO TABS: 50.0000 mg | ORAL_TABLET | Freq: Two times a day (BID) | ORAL | 1 refills | Status: DC

## 2020-10-19 NOTE — Patient Instructions (Addendum)
Increase losartan to 100mg  total per day for now. Can try taking 2 of the losartan 50mg  (1 twice per day).  I will refer you to hypertension clinic to discuss possible other testing for secondary causes, especially with family history of neuroendocrine tumor.   Return to the clinic or go to the nearest emergency room if any of your symptoms worsen or new symptoms occur.     Managing Your Hypertension Hypertension is commonly called high blood pressure. This is when the force of your blood pressing against the walls of your arteries is too strong. Arteries are blood vessels that carry blood from your heart throughout your body. Hypertension forces the heart to work harder to pump blood, and may cause the arteries to become narrow or stiff. Having untreated or uncontrolled hypertension can cause heart attack, stroke, kidney disease, and other problems. What are blood pressure readings? A blood pressure reading consists of a higher number over a lower number. Ideally, your blood pressure should be below 120/80. The first ("top") number is called the systolic pressure. It is a measure of the pressure in your arteries as your heart beats. The second ("bottom") number is called the diastolic pressure. It is a measure of the pressure in your arteries as the heart relaxes. What does my blood pressure reading mean? Blood pressure is classified into four stages. Based on your blood pressure reading, your health care provider may use the following stages to determine what type of treatment you need, if any. Systolic pressure and diastolic pressure are measured in a unit called mm Hg. Normal  Systolic pressure: below 035.  Diastolic pressure: below 80. Elevated  Systolic pressure: 009-381.  Diastolic pressure: below 80. Hypertension stage 1  Systolic pressure: 829-937.  Diastolic pressure: 16-96. Hypertension stage 2  Systolic pressure: 789 or above.  Diastolic pressure: 90 or above. What  health risks are associated with hypertension? Managing your hypertension is an important responsibility. Uncontrolled hypertension can lead to:  A heart attack.  A stroke.  A weakened blood vessel (aneurysm).  Heart failure.  Kidney damage.  Eye damage.  Metabolic syndrome.  Memory and concentration problems. What changes can I make to manage my hypertension? Hypertension can be managed by making lifestyle changes and possibly by taking medicines. Your health care provider will help you make a plan to bring your blood pressure within a normal range. Eating and drinking   Eat a diet that is high in fiber and potassium, and low in salt (sodium), added sugar, and fat. An example eating plan is called the DASH (Dietary Approaches to Stop Hypertension) diet. To eat this way: ? Eat plenty of fresh fruits and vegetables. Try to fill half of your plate at each meal with fruits and vegetables. ? Eat whole grains, such as whole wheat pasta, brown rice, or whole grain bread. Fill about one quarter of your plate with whole grains. ? Eat low-fat diary products. ? Avoid fatty cuts of meat, processed or cured meats, and poultry with skin. Fill about one quarter of your plate with lean proteins such as fish, chicken without skin, beans, eggs, and tofu. ? Avoid premade and processed foods. These tend to be higher in sodium, added sugar, and fat.  Reduce your daily sodium intake. Most people with hypertension should eat less than 1,500 mg of sodium a day.  Limit alcohol intake to no more than 1 drink a day for nonpregnant women and 2 drinks a day for men. One drink equals 12  oz of beer, 5 oz of wine, or 1 oz of hard liquor. Lifestyle  Work with your health care provider to maintain a healthy body weight, or to lose weight. Ask what an ideal weight is for you.  Get at least 30 minutes of exercise that causes your heart to beat faster (aerobic exercise) most days of the week. Activities may  include walking, swimming, or biking.  Include exercise to strengthen your muscles (resistance exercise), such as weight lifting, as part of your weekly exercise routine. Try to do these types of exercises for 30 minutes at least 3 days a week.  Do not use any products that contain nicotine or tobacco, such as cigarettes and e-cigarettes. If you need help quitting, ask your health care provider.  Control any long-term (chronic) conditions you have, such as high cholesterol or diabetes. Monitoring  Monitor your blood pressure at home as told by your health care provider. Your personal target blood pressure may vary depending on your medical conditions, your age, and other factors.  Have your blood pressure checked regularly, as often as told by your health care provider. Working with your health care provider  Review all the medicines you take with your health care provider because there may be side effects or interactions.  Talk with your health care provider about your diet, exercise habits, and other lifestyle factors that may be contributing to hypertension.  Visit your health care provider regularly. Your health care provider can help you create and adjust your plan for managing hypertension. Will I need medicine to control my blood pressure? Your health care provider may prescribe medicine if lifestyle changes are not enough to get your blood pressure under control, and if:  Your systolic blood pressure is 130 or higher.  Your diastolic blood pressure is 80 or higher. Take medicines only as told by your health care provider. Follow the directions carefully. Blood pressure medicines must be taken as prescribed. The medicine does not work as well when you skip doses. Skipping doses also puts you at risk for problems. Contact a health care provider if:  You think you are having a reaction to medicines you have taken.  You have repeated (recurrent) headaches.  You feel dizzy.  You  have swelling in your ankles.  You have trouble with your vision. Get help right away if:  You develop a severe headache or confusion.  You have unusual weakness or numbness, or you feel faint.  You have severe pain in your chest or abdomen.  You vomit repeatedly.  You have trouble breathing. Summary  Hypertension is when the force of blood pumping through your arteries is too strong. If this condition is not controlled, it may put you at risk for serious complications.  Your personal target blood pressure may vary depending on your medical conditions, your age, and other factors. For most people, a normal blood pressure is less than 120/80.  Hypertension is managed by lifestyle changes, medicines, or both. Lifestyle changes include weight loss, eating a healthy, low-sodium diet, exercising more, and limiting alcohol. This information is not intended to replace advice given to you by your health care provider. Make sure you discuss any questions you have with your health care provider. Document Revised: 02/15/2019 Document Reviewed: 09/21/2016 Elsevier Patient Education  [employer].   If you have lab work done today you will be contacted with your lab results within the next 2 weeks.  If you have not heard from [place] then please  contact us. The fastest way to get your results is to register for My Chart.   IF you received an x-ray today, you will receive an invoice from [facility]. Please contact [facility] at [phone] with questions or concerns regarding your invoice.   IF you received labwork today, you will receive an invoice from [place]. Please contact [facility] at [phone] with questions or concerns regarding your invoice.   Our billing staff will not be able to assist you with questions regarding bills from these companies.  You will be contacted with the lab results as soon as they are available. The fastest way to get your results is to  activate your My Chart account. Instructions are located on the last page of this paperwork. If you have not heard from [place] regarding the results in 2 weeks, please contact this office.

## 2020-10-19 NOTE — Progress Notes (Signed)
Subjective:  Patient ID: Sarah Beasley, female    DOB: [birth year]  Age: 59 y.o. MRN: [id number]  CC:  Chief Complaint  Patient presents with   Follow-up    On hypertension. Pt reports checking her BP at home, and for the most part BP readings have been goo. Pt reports she did have 2 separate days where the pt had really high BP. On states during she had some feeling of feeling dizzy and light headed during the high BP readings. Pt states when she checks her BP at home the home monitor gives her the irregular heart rate sign. PT also reports a pain in her upper left side of her back.     HPI [name] presents for   Hypertension: Follow-up from elevated readings at November 11 visit.  Started on losartan 25 mg daily - she has been taking full 50mg  per day.  As above has had a few episodes where she has had elevated readings - had spike - 211/110  - 1 week ago. felt with feeling of lightheadedness and dizziness, no syncope. No associated palpitations, CP, flushed feeling. Minimal HA. Was at a stressful time, but had not been feeling well to begin with. Some increased salt that weekend possible.   Concerned that her home heart rate monitor occasionally says irregular heart rate at times. Denies feeling of palpitations. Home readings: 130-140'S/80-90.  brother had neuroendocrine tumor - passed away at age 40 due to subsequent heart issues.  No current cardiologist - prior cardiologist in [place] for elevated BP and heart palpitations.  Unable to use CPAP in past - too difficult. Last sleep specialist eval in 2017. Snoring with nighttime awakening. Sleep studying 06/2016- AHI 14.5 during REM, 12. 5 supine.   BP Readings from Last 3 Encounters:  10/19/20 (!) 149/89  09/17/20 (!) 148/92  12/15/18 (!) 160/95   Lab Results  Component Value Date   CREATININE 0.62 09/17/2020   Left upper back pain. Past 2 days, minimal, has been decorating, more lifting. NKI. Only mild, but concerned  with BP above.  No radiation down arm, not weak, no limitations. No treatments.   History   Patient Active Problem List   Diagnosis Date Noted   Hematuria 07/07/2017   Past Medical History:  Diagnosis Date   Heart murmur    Hypertension    Past Surgical History:  Procedure Laterality Date   KNEE SURGERY Left    No Known Allergies Prior to Admission medications   Medication Sig Start Date End Date Taking? Authorizing Provider  Ibuprofen (ADVIL PO) Take by mouth.    [provider]  losartan (COZAAR) 50 MG tablet Take 0.5 tablets (25 mg total) by mouth daily. 09/17/20   [name], MD   Social History   Socioeconomic History   Marital status: Single    Spouse name: Not on file   Number of children: 0   Years of education: BA   Highest education level: Not on file  Occupational History   Not on file  Tobacco Use   Smoking status: Never Smoker   Smokeless tobacco: Never Used  Vaping Use   Vaping Use: Never used  Substance and Sexual Activity   Alcohol use: Yes    Alcohol/week: 10.0 standard drinks    Types: 10 Glasses of wine per week    Comment: 10 glasses through out the week   Drug use: No   Sexual activity: Not Currently  Other Topics Concern  Not on file  Social History Narrative   Exercise walking for 1 hour   Drinks 1 cup of coffee in the morning.    Social Determinants of Health   Financial Resource Strain: Not on file  Food Insecurity: Not on file  Transportation Needs: Not on file  Physical Activity: Not on file  Stress: Not on file  Social Connections: Not on file  Intimate Partner Violence: Not on file    Review of Systems  Constitutional: Negative for fatigue (other than in hpi) and unexpected weight change.  Respiratory: Negative for chest tightness and shortness of breath.   Cardiovascular: Negative for chest pain, palpitations and leg swelling.  Gastrointestinal: Negative for abdominal pain and blood in  stool.  Neurological: Negative for dizziness, syncope, light-headedness and headaches.     Objective:   Vitals:   10/19/20 1047 10/19/20 1054  BP: (!) 143/83 (!) 149/89  Pulse: 72   Temp: 98.4 F (36.9 C)   TempSrc: Temporal   SpO2: 98%   Weight: 140 lb (63.5 kg)   Height: 5' (1.524 m)      Physical Exam Vitals reviewed.  Constitutional:      Appearance: She is well-developed and well-nourished.  HENT:     Head: Normocephalic and atraumatic.  Eyes:     Extraocular Movements: EOM normal.     Conjunctiva/sclera: Conjunctivae normal.     Pupils: Pupils are equal, round, and reactive to light.  Neck:     Vascular: No carotid bruit.  Cardiovascular:     Rate and Rhythm: Normal rate and regular rhythm.     Pulses: Intact distal pulses.     Heart sounds: Normal heart sounds.     Comments: No ectopy.  Pulmonary:     Effort: Pulmonary effort is normal.     Breath sounds: Normal breath sounds.  Abdominal:     Palpations: Abdomen is soft. There is no pulsatile mass.     Tenderness: There is no abdominal tenderness.  Musculoskeletal:     Comments: Min ttp over lower left trapezius. No bony ttp.   Skin:    General: Skin is warm and dry.  Neurological:     Mental Status: She is alert and oriented to person, place, and time.  Psychiatric:        Mood and Affect: Mood and affect normal.        Behavior: Behavior normal.    EKG: SR, no acute findings. No prior EKG available for review.   Assessment & Plan:  Sarah Beasley is a 59 y.o. female . Essential hypertension - Plan: Ambulatory referral to Sleep Studies, TSH, Basic metabolic panel, Ambulatory referral to Cardiology, losartan (COZAAR) 50 MG tablet, Basic metabolic panel OSA (obstructive sleep apnea) - Plan: Ambulatory referral to Sleep Studies Irregular heart rate - Plan: TSH, EKG 48-OLMB, Basic metabolic panel, Ambulatory referral to Cardiology Family history of endocrine disorder - Plan: TSH, Ambulatory  referral to Cardiology  - decreased control. Few episodes of marked readings above, but no palpitations/HA. Family history of neuroendocrine tumor.   - increase losartan to 50mg  BID. Potential side effects of higher dosing discussed with rtc precautions.   - refer back to sleep specialist to see if this may be contributing. Mild-moderate prior, ?option of dental device.   - refer to cardiology/HTN clinic   - recheck 1 month  Upper back pain: Likely overuse/strain sx care with rtc precautions.    Meds ordered this encounter  Medications   losartan (COZAAR)  50 MG tablet    Sig: Take 1 tablet (50 mg total) by mouth in the morning and at bedtime.    Dispense:  60 tablet    Refill:  1   Patient Instructions    Increase losartan to 100mg  total per day for now. Can try taking 2 of the losartan 50mg  (1 twice per day).  I will refer you to hypertension clinic to discuss possible other testing for secondary causes, especially with family history of neuroendocrine tumor.   Return to the clinic or go to the nearest emergency room if any of your symptoms worsen or new symptoms occur.     Managing Your Hypertension Hypertension is commonly called high blood pressure. This is when the force of your blood pressing against the walls of your arteries is too strong. Arteries are blood vessels that carry blood from your heart throughout your body. Hypertension forces the heart to work harder to pump blood, and may cause the arteries to become narrow or stiff. Having untreated or uncontrolled hypertension can cause heart attack, stroke, kidney disease, and other problems. What are blood pressure readings? A blood pressure reading consists of a higher number over a lower number. Ideally, your blood pressure should be below 120/80. The first ("top") number is called the systolic pressure. It is a measure of the pressure in your arteries as your heart beats. The second ("bottom") number is called the  diastolic pressure. It is a measure of the pressure in your arteries as the heart relaxes. What does my blood pressure reading mean? Blood pressure is classified into four stages. Based on your blood pressure reading, your health care provider may use the following stages to determine what type of treatment you need, if any. Systolic pressure and diastolic pressure are measured in a unit called mm Hg. Normal  Systolic pressure: below 431.  Diastolic pressure: below 80. Elevated  Systolic pressure: 540-086.  Diastolic pressure: below 80. Hypertension stage 1  Systolic pressure: 761-950.  Diastolic pressure: 93-26. Hypertension stage 2  Systolic pressure: 712 or above.  Diastolic pressure: 90 or above. What health risks are associated with hypertension? Managing your hypertension is an important responsibility. Uncontrolled hypertension can lead to:  A heart attack.  A stroke.  A weakened blood vessel (aneurysm).  Heart failure.  Kidney damage.  Eye damage.  Metabolic syndrome.  Memory and concentration problems. What changes can I make to manage my hypertension? Hypertension can be managed by making lifestyle changes and possibly by taking medicines. Your health care provider will help you make a plan to bring your blood pressure within a normal range. Eating and drinking   Eat a diet that is high in fiber and potassium, and low in salt (sodium), added sugar, and fat. An example eating plan is called the DASH (Dietary Approaches to Stop Hypertension) diet. To eat this way: ? Eat plenty of fresh fruits and vegetables. Try to fill half of your plate at each meal with fruits and vegetables. ? Eat whole grains, such as whole wheat pasta, brown rice, or whole grain bread. Fill about one quarter of your plate with whole grains. ? Eat low-fat diary products. ? Avoid fatty cuts of meat, processed or cured meats, and poultry with skin. Fill about one quarter of your plate with  lean proteins such as fish, chicken without skin, beans, eggs, and tofu. ? Avoid premade and processed foods. These tend to be higher in sodium, added sugar, and fat.  Reduce your daily  sodium intake. Most people with hypertension should eat less than 1,500 mg of sodium a day.  Limit alcohol intake to no more than 1 drink a day for nonpregnant women and 2 drinks a day for men. One drink equals 12 oz of beer, 5 oz of wine, or 1 oz of hard liquor. Lifestyle  Work with your health care provider to maintain a healthy body weight, or to lose weight. Ask what an ideal weight is for you.  Get at least 30 minutes of exercise that causes your heart to beat faster (aerobic exercise) most days of the week. Activities may include walking, swimming, or biking.  Include exercise to strengthen your muscles (resistance exercise), such as weight lifting, as part of your weekly exercise routine. Try to do these types of exercises for 30 minutes at least 3 days a week.  Do not use any products that contain nicotine or tobacco, such as cigarettes and e-cigarettes. If you need help quitting, ask your health care provider.  Control any long-term (chronic) conditions you have, such as high cholesterol or diabetes. Monitoring  Monitor your blood pressure at home as told by your health care provider. Your personal target blood pressure may vary depending on your medical conditions, your age, and other factors.  Have your blood pressure checked regularly, as often as told by your health care provider. Working with your health care provider  Review all the medicines you take with your health care provider because there may be side effects or interactions.  Talk with your health care provider about your diet, exercise habits, and other lifestyle factors that may be contributing to hypertension.  Visit your health care provider regularly. Your health care provider can help you create and adjust your plan for  managing hypertension. Will I need medicine to control my blood pressure? Your health care provider may prescribe medicine if lifestyle changes are not enough to get your blood pressure under control, and if:  Your systolic blood pressure is 130 or higher.  Your diastolic blood pressure is 80 or higher. Take medicines only as told by your health care provider. Follow the directions carefully. Blood pressure medicines must be taken as prescribed. The medicine does not work as well when you skip doses. Skipping doses also puts you at risk for problems. Contact a health care provider if:  You think you are having a reaction to medicines you have taken.  You have repeated (recurrent) headaches.  You feel dizzy.  You have swelling in your ankles.  You have trouble with your vision. Get help right away if:  You develop a severe headache or confusion.  You have unusual weakness or numbness, or you feel faint.  You have severe pain in your chest or abdomen.  You vomit repeatedly.  You have trouble breathing. Summary  Hypertension is when the force of blood pumping through your arteries is too strong. If this condition is not controlled, it may put you at risk for serious complications.  Your personal target blood pressure may vary depending on your medical conditions, your age, and other factors. For most people, a normal blood pressure is less than 120/80.  Hypertension is managed by lifestyle changes, medicines, or both. Lifestyle changes include weight loss, eating a healthy, low-sodium diet, exercising more, and limiting alcohol. This information is not intended to replace advice given to you by your health care provider. Make sure you discuss any questions you have with your health care provider. Document Revised: 02/15/2019 Document  Reviewed: 09/21/2016 Elsevier Patient Education  [employer].   If you have lab work done today you will be contacted with your lab results  within the next 2 weeks.  If you have not heard from [place] then please contact us. The fastest way to get your results is to register for My Chart.   IF you received an x-ray today, you will receive an invoice from [facility]. Please contact [facility] at [phone] with questions or concerns regarding your invoice.   IF you received labwork today, you will receive an invoice from [place]. Please contact [facility] at [phone] with questions or concerns regarding your invoice.   Our billing staff will not be able to assist you with questions regarding bills from these companies.  You will be contacted with the lab results as soon as they are available. The fastest way to get your results is to activate your My Chart account. Instructions are located on the last page of this paperwork. If you have not heard from [place] regarding the results in 2 weeks, please contact this office.         Signed, Merri Ray, MD [facility]

## 2020-10-20 LAB — BASIC METABOLIC PANEL
BUN/Creatinine Ratio: 15 (ref 9–23)
BUN: 11 mg/dL (ref 6–24)
CO2: 19 mmol/L — ABNORMAL LOW (ref 20–29)
Calcium: 10.2 mg/dL (ref 8.7–10.2)
Chloride: 101 mmol/L (ref 96–106)
Creatinine, Ser: 0.72 mg/dL (ref 0.57–1.00)
GFR calc Af Amer: 106 mL/min/{1.73_m2} (ref >59)
GFR calc non Af Amer: 92 mL/min/{1.73_m2} (ref >59)
Glucose: 85 mg/dL (ref 65–99)
Potassium: 4.3 mmol/L (ref 3.5–5.2)
Sodium: 138 mmol/L (ref 134–144)

## 2020-10-20 LAB — TSH: TSH: 1.14 u[IU]/mL (ref 0.450–4.500)

## 2020-10-23 ENCOUNTER — Ambulatory Visit: Payer: BC Managed Care – PPO

## 2020-11-12 ENCOUNTER — Other Ambulatory Visit: Payer: Self-pay | Admitting: Family Medicine

## 2020-11-12 DIAGNOSIS — I10 Essential (primary) hypertension: Secondary | ICD-10-CM

## 2020-11-20 ENCOUNTER — Ambulatory Visit: Payer: 59 | Admitting: Family Medicine

## 2020-11-20 ENCOUNTER — Ambulatory Visit (INDEPENDENT_AMBULATORY_CARE_PROVIDER_SITE_OTHER): Payer: 59 | Admitting: Family Medicine

## 2020-11-20 ENCOUNTER — Encounter: Payer: Self-pay | Admitting: Family Medicine

## 2020-11-20 ENCOUNTER — Other Ambulatory Visit: Payer: Self-pay

## 2020-11-20 VITALS — BP 140/92 | HR 85 | Temp 98.5°F | Ht 60.0 in | Wt 141.2 lb

## 2020-11-20 DIAGNOSIS — I10 Essential (primary) hypertension: Secondary | ICD-10-CM

## 2020-11-20 DIAGNOSIS — F439 Reaction to severe stress, unspecified: Secondary | ICD-10-CM

## 2020-11-20 MED ORDER — LOSARTAN POTASSIUM 50 MG PO TABS: 50.0000 mg | ORAL_TABLET | Freq: Every day | ORAL | 1 refills | Status: DC

## 2020-11-20 NOTE — Patient Instructions (Addendum)
Based on home readings okay to continue losartan 50 mg once per day for now.  If you do have readings above 140/90, increase that medication to twice per day and let me know.  Keep follow-up with sleep specialist as well as cardiologist, but let me know if there are questions in the meantime.  See information below regarding stress and stress management but let me know if I can help further.  Textbook of family medicine (9th ed., pp. 2440-1027). [place]: [employer].">  Stress, Adult Stress is a normal reaction to life events. Stress is what you feel when life demands more than you are used to, or more than you think you can handle. Some stress can be useful, such as studying for a test or meeting a deadline at work. Stress that occurs too often or for too long can cause problems. It can affect your emotional health and interfere with relationships and normal daily activities. Too much stress can weaken your body's defense system (immune system) and increase your risk for physical illness. If you already have a medical problem, stress can make it worse. What are the causes? All sorts of life events can cause stress. An event that causes stress for one person may not be stressful for another person. Major life events, whether positive or negative, commonly cause stress. Examples include:  Losing a job or starting a new job.  Losing a loved one.  Moving to a new town or home.  Getting married or divorced.  Having a baby.  Getting injured or sick. Less obvious life events can also cause stress, especially if they occur day after day or in combination with each other. Examples include:  Working long hours.  Driving in traffic.  Caring for children.  Being in debt.  Being in a difficult relationship. What are the signs or symptoms? Stress can cause emotional symptoms, including:  Anxiety. This is feeling worried, afraid, on edge, overwhelmed, or out of control.  Anger, including  irritation or impatience.  Depression. This is feeling sad, down, helpless, or guilty.  Trouble focusing, remembering, or making decisions. Stress can cause physical symptoms, including:  Aches and pains. These may affect your head, neck, back, stomach, or other areas of your body.  Tight muscles or a clenched jaw.  Low energy.  Trouble sleeping. Stress can cause unhealthy behaviors, including:  Eating to feel better (overeating) or skipping meals.  Working too much or putting off tasks.  Smoking, drinking alcohol, or using drugs to feel better. How is this diagnosed? Stress is diagnosed through an assessment by your health care provider. He or she may diagnose this condition based on:  Your symptoms and any stressful life events.  Your medical history.  Tests to rule out other causes of your symptoms. Depending on your condition, your health care provider may refer you to a specialist for further evaluation. How is this treated? Stress management techniques are the recommended treatment for stress. Medicine is not typically recommended for the treatment of stress. Techniques to reduce your reaction to stressful life events include:  Stress identification. Monitor yourself for symptoms of stress and identify what causes stress for you. These skills may help you to avoid or prepare for stressful events.  Time management. Set your priorities, keep a calendar of events, and learn to say no. Taking these actions can help you avoid making too many commitments. Techniques for coping with stress include:  Rethinking the problem. Try to think realistically about stressful events rather  than ignoring them or overreacting. Try to find the positives in a stressful situation rather than focusing on the negatives.  Exercise. Physical exercise can release both physical and emotional tension. The key is to find a form of exercise that you enjoy and do it regularly.  Relaxation techniques.  These relax the body and mind. The key is to find one or more that you enjoy and use the techniques regularly. Examples include: ? Meditation, deep breathing, or progressive relaxation techniques. ? Yoga or tai chi. ? Biofeedback, mindfulness techniques, or journaling. ? Listening to music, being out in nature, or participating in other hobbies.  Practicing a healthy lifestyle. Eat a balanced diet, drink plenty of water, limit or avoid caffeine, and get plenty of sleep.  Having a strong support network. Spend time with family, friends, or other people you enjoy being around. Express your feelings and talk things over with someone you trust. Counseling or talk therapy with a mental health professional may be helpful if you are having trouble managing stress on your own.   Follow these instructions at home: Lifestyle  Avoid drugs.  Do not use any products that contain nicotine or tobacco, such as cigarettes, e-cigarettes, and chewing tobacco. If you need help quitting, ask your health care provider.  Limit alcohol intake to no more than 1 drink a day for nonpregnant women and 2 drinks a day for men. One drink equals 12 oz of beer, 5 oz of wine, or 1 oz of hard liquor  Do not use alcohol or drugs to relax.  Eat a balanced diet that includes fresh fruits and vegetables, whole grains, lean meats, fish, eggs, and beans, and low-fat dairy. Avoid processed foods and foods high in added fat, sugar, and salt.  Exercise at least 30 minutes on 5 or more days each week.  Get 7-8 hours of sleep each night.   General instructions  Practice stress management techniques as discussed with your health care provider.  Drink enough fluid to keep your urine clear or pale yellow.  Take over-the-counter and prescription medicines only as told by your health care provider.  Keep all follow-up visits as told by your health care provider. This is important.   Contact a health care provider if:  Your  symptoms get worse.  You have new symptoms.  You feel overwhelmed by your problems and can no longer manage them on your own. Get help right away if:  You have thoughts of hurting yourself or others. If you ever feel like you may hurt yourself or others, or have thoughts about taking your own life, get help right away. You can go to your nearest emergency department or call:  Your local emergency services (911 in the [place]  A suicide crisis helpline, such as the [facility] at (919)630-3743. This is open 24 hours a day. Summary  Stress is a normal reaction to life events. It can cause problems if it happens too often or for too long.  Practicing stress management techniques is the best way to treat stress.  Counseling or talk therapy with a mental health professional may be helpful if you are having trouble managing stress on your own. This information is not intended to replace advice given to you by your health care provider. Make sure you discuss any questions you have with your health care provider. Document Revised: 07/10/2020 Document Reviewed: 07/10/2020 Elsevier Patient Education  2021 [employer].   If you have lab work done today  you will be contacted with your lab results within the next 2 weeks.  If you have not heard from [place] then please contact us. The fastest way to get your results is to register for My Chart.   IF you received an x-ray today, you will receive an invoice from [facility]. Please contact [facility] at [phone] with questions or concerns regarding your invoice.   IF you received labwork today, you will receive an invoice from [facility]. Please contact [facility] at [phone] with questions or concerns regarding your invoice.   Our billing staff will not be able to assist you with questions regarding bills from these companies.  You will be contacted with the lab results as soon as they are available.  The fastest way to get your results is to activate your My Chart account. Instructions are located on the last page of this paperwork. If you have not heard from [place] regarding the results in 2 weeks, please contact this office.      Textbook of family medicine (9th ed., pp. 4431-5400). [place], [place]: [employer].">  Stress, Adult Stress is a normal reaction to life events. Stress is what you feel when life demands more than you are used to, or more than you think you can handle. Some stress can be useful, such as studying for a test or meeting a deadline at work. Stress that occurs too often or for too long can cause problems. It can affect your emotional health and interfere with relationships and normal daily activities. Too much stress can weaken your body's defense system (immune system) and increase your risk for physical illness. If you already have a medical problem, stress can make it worse. What are the causes? All sorts of life events can cause stress. An event that causes stress for one person may not be stressful for another person. Major life events, whether positive or negative, commonly cause stress. Examples include:  Losing a job or starting a new job.  Losing a loved one.  Moving to a new town or home.  Getting married or divorced.  Having a baby.  Getting injured or sick. Less obvious life events can also cause stress, especially if they occur day after day or in combination with each other. Examples include:  Working long hours.  Driving in traffic.  Caring for children.  Being in debt.  Being in a difficult relationship. What are the signs or symptoms? Stress can cause emotional symptoms, including:  Anxiety. This is feeling worried, afraid, on edge, overwhelmed, or out of control.  Anger, including irritation or impatience.  Depression. This is feeling sad, down, helpless, or guilty.  Trouble focusing, remembering, or making decisions. Stress can cause  physical symptoms, including:  Aches and pains. These may affect your head, neck, back, stomach, or other areas of your body.  Tight muscles or a clenched jaw.  Low energy.  Trouble sleeping. Stress can cause unhealthy behaviors, including:  Eating to feel better (overeating) or skipping meals.  Working too much or putting off tasks.  Smoking, drinking alcohol, or using drugs to feel better. How is this diagnosed? Stress is diagnosed through an assessment by your health care provider. He or she may diagnose this condition based on:  Your symptoms and any stressful life events.  Your medical history.  Tests to rule out other causes of your symptoms. Depending on your condition, your health care provider may refer you to a specialist for further evaluation. How is this treated? Stress management  techniques are the recommended treatment for stress. Medicine is not typically recommended for the treatment of stress. Techniques to reduce your reaction to stressful life events include:  Stress identification. Monitor yourself for symptoms of stress and identify what causes stress for you. These skills may help you to avoid or prepare for stressful events.  Time management. Set your priorities, keep a calendar of events, and learn to say no. Taking these actions can help you avoid making too many commitments. Techniques for coping with stress include:  Rethinking the problem. Try to think realistically about stressful events rather than ignoring them or overreacting. Try to find the positives in a stressful situation rather than focusing on the negatives.  Exercise. Physical exercise can release both physical and emotional tension. The key is to find a form of exercise that you enjoy and do it regularly.  Relaxation techniques. These relax the body and mind. The key is to find one or more that you enjoy and use the techniques regularly. Examples include: ? Meditation, deep breathing,  or progressive relaxation techniques. ? Yoga or tai chi. ? Biofeedback, mindfulness techniques, or journaling. ? Listening to music, being out in nature, or participating in other hobbies.  Practicing a healthy lifestyle. Eat a balanced diet, drink plenty of water, limit or avoid caffeine, and get plenty of sleep.  Having a strong support network. Spend time with family, friends, or other people you enjoy being around. Express your feelings and talk things over with someone you trust. Counseling or talk therapy with a mental health professional may be helpful if you are having trouble managing stress on your own.   Follow these instructions at home: Lifestyle  Avoid drugs.  Do not use any products that contain nicotine or tobacco, such as cigarettes, e-cigarettes, and chewing tobacco. If you need help quitting, ask your health care provider.  Limit alcohol intake to no more than 1 drink a day for nonpregnant women and 2 drinks a day for men. One drink equals 12 oz of beer, 5 oz of wine, or 1 oz of hard liquor  Do not use alcohol or drugs to relax.  Eat a balanced diet that includes fresh fruits and vegetables, whole grains, lean meats, fish, eggs, and beans, and low-fat dairy. Avoid processed foods and foods high in added fat, sugar, and salt.  Exercise at least 30 minutes on 5 or more days each week.  Get 7-8 hours of sleep each night.   General instructions  Practice stress management techniques as discussed with your health care provider.  Drink enough fluid to keep your urine clear or pale yellow.  Take over-the-counter and prescription medicines only as told by your health care provider.  Keep all follow-up visits as told by your health care provider. This is important.   Contact a health care provider if:  Your symptoms get worse.  You have new symptoms.  You feel overwhelmed by your problems and can no longer manage them on your own. Get help right away if:  You  have thoughts of hurting yourself or others. If you ever feel like you may hurt yourself or others, or have thoughts about taking your own life, get help right away. You can go to your nearest emergency department or call:  Your local emergency services (911 in the [place]  A suicide crisis helpline, such as the [facility] at 909-702-2089. This is open 24 hours a day. Summary  Stress is a normal reaction to life  events. It can cause problems if it happens too often or for too long.  Practicing stress management techniques is the best way to treat stress.  Counseling or talk therapy with a mental health professional may be helpful if you are having trouble managing stress on your own. This information is not intended to replace advice given to you by your health care provider. Make sure you discuss any questions you have with your health care provider. Document Revised: 07/10/2020 Document Reviewed: 07/10/2020 Elsevier Patient Education  2021 [employer].

## 2020-11-20 NOTE — Progress Notes (Signed)
Subjective:  Patient ID: Sarah Beasley, female    DOB: Apr 15, 1961  Age: 61 y.o. MRN: [id number]  CC:  Chief Complaint  Patient presents with  . Hypertension    F/u     HPI Sarah Beasley presents for    Hypertension: Follow-up from December 13.  Still elevated 149/89 at that time.  Losartan increased from 25 mg to 50 mg previously.  Had reported spike in blood pressure 1 week prior.  Losartan increased to 100 mg daily.  Referred to hypertension clinic to discuss testing for secondary causes especially with family history of neuroendocrine tumor.  Appointment March 1 with Dr. Oval Linsey. No further spikes in BP.  Normal BP on vacation.   Home readings: variable. today 133/76, 127/85, 116/77, 108/? Usual readings past few weeks - 120 -130/80's usually. Some lower readings in 115/80?Marland Kitchen No lightheadedness at lower readings - felt fine. Had been taking losartan $RemoveBeforeD'50mg'HqEaCiSxZnpjge$  BID, then stopped nighttime dose 2 weeks ago (concerned about lower readings). Now on losartan $RemoveBef'50mg'KeHudwHTko$  qd.  Hx of OSA, plan on sleep specialist eval next week.  Hx of elevated readings in medical office.  BP goes up with stress - less tolerant of stress. Denies depression/anxiety.  Mgt technique for stress - unknown. Tries to remove self from situation.   BP Readings from Last 3 Encounters:  11/20/20 (!) 140/92  10/19/20 (!) 149/89  09/17/20 (!) 148/92   Lab Results  Component Value Date   CREATININE 0.72 10/19/2020      History Patient Active Problem List   Diagnosis Date Noted  . Hematuria 07/07/2017   Past Medical History:  Diagnosis Date  . Heart murmur   . Hypertension    Past Surgical History:  Procedure Laterality Date  . KNEE SURGERY Left    No Known Allergies Prior to Admission medications   Medication Sig Start Date End Date Taking? Authorizing Provider  Ibuprofen (ADVIL PO) Take by mouth.   Yes [provider]  losartan (COZAAR) 50 MG tablet Take 1 tablet (50 mg total) by  mouth in the morning and at bedtime. 10/19/20  Yes [name], MD   Social History   Socioeconomic History  . Marital status: Single    Spouse name: Not on file  . Number of children: 0  . Years of education: BA  . Highest education level: Not on file  Occupational History  . Not on file  Tobacco Use  . Smoking status: Never Smoker  . Smokeless tobacco: Never Used  Vaping Use  . Vaping Use: Never used  Substance and Sexual Activity  . Alcohol use: Yes    Alcohol/week: 10.0 standard drinks    Types: 10 Glasses of wine per week    Comment: 10 glasses through out the week  . Drug use: No  . Sexual activity: Not Currently  Other Topics Concern  . Not on file  Social History Narrative   Exercise walking for 1 hour   Drinks 1 cup of coffee in the morning.    Social Determinants of Health   Financial Resource Strain: Not on file  Food Insecurity: Not on file  Transportation Needs: Not on file  Physical Activity: Not on file  Stress: Not on file  Social Connections: Not on file  Intimate Partner Violence: Not on file    Review of Systems  Constitutional: Negative for fatigue and unexpected weight change.  Respiratory: Negative for chest tightness and shortness of breath.   Cardiovascular: Negative for chest  pain, palpitations and leg swelling.  Gastrointestinal: Negative for abdominal pain and blood in stool.  Neurological: Negative for dizziness, syncope, light-headedness and headaches.     Objective:   Vitals:   11/20/20 1452 11/20/20 1456  BP: (!) 149/92 (!) 140/92  Pulse: 85   Temp: 98.5 F (36.9 C)   TempSrc: Temporal   SpO2: 96%   Weight: 141 lb 3.2 oz (64 kg)   Height: 5' (1.524 m)      Physical Exam Vitals reviewed.  Constitutional:      Appearance: She is well-developed and well-nourished.  HENT:     Head: Normocephalic and atraumatic.  Eyes:     Extraocular Movements: EOM normal.     Conjunctiva/sclera: Conjunctivae normal.      Pupils: Pupils are equal, round, and reactive to light.  Neck:     Vascular: No carotid bruit.  Cardiovascular:     Rate and Rhythm: Normal rate and regular rhythm.     Pulses: Intact distal pulses.     Heart sounds: Normal heart sounds.  Pulmonary:     Effort: Pulmonary effort is normal.     Breath sounds: Normal breath sounds.  Abdominal:     Palpations: Abdomen is soft. There is no pulsatile mass.     Tenderness: There is no abdominal tenderness.  Skin:    General: Skin is warm and dry.  Neurological:     Mental Status: She is alert and oriented to person, place, and time.  Psychiatric:        Mood and Affect: Mood and affect normal.        Behavior: Behavior normal.        Assessment & Plan:  Sarah Beasley is a 60 y.o. female . Essential hypertension - Plan: losartan (COZAAR) 50 MG tablet Improved control on home readings without recent spikes in blood pressure.  We will continue same regimen for now as component of whitecoat hypertension likely with elevated readings in office.  Continue follow-up with cardiology as planned.  RTC precautions given.  -Keep follow-up with sleep specialist  Situational stress with stress management discussed, handout given.  RTC precautions  Meds ordered this encounter  Medications  . losartan (COZAAR) 50 MG tablet    Sig: Take 1 tablet (50 mg total) by mouth daily.    Dispense:  90 tablet    Refill:  1   Patient Instructions   Based on home readings okay to continue losartan 50 mg once per day for now.  If you do have readings above 140/90, increase that medication to twice per day and let me know.  Keep follow-up with sleep specialist as well as cardiologist, but let me know if there are questions in the meantime.  See information below regarding stress and stress management but let me know if I can help further.  Textbook of family medicine (9th ed., pp. 2683-4196). [place], [place]: [employer].">  Stress, Adult Stress is a  normal reaction to life events. Stress is what you feel when life demands more than you are used to, or more than you think you can handle. Some stress can be useful, such as studying for a test or meeting a deadline at work. Stress that occurs too often or for too long can cause problems. It can affect your emotional health and interfere with relationships and normal daily activities. Too much stress can weaken your body's defense system (immune system) and increase your risk for physical illness. If you already have a  medical problem, stress can make it worse. What are the causes? All sorts of life events can cause stress. An event that causes stress for one person may not be stressful for another person. Major life events, whether positive or negative, commonly cause stress. Examples include:  Losing a job or starting a new job.  Losing a loved one.  Moving to a new town or home.  Getting married or divorced.  Having a baby.  Getting injured or sick. Less obvious life events can also cause stress, especially if they occur day after day or in combination with each other. Examples include:  Working long hours.  Driving in traffic.  Caring for children.  Being in debt.  Being in a difficult relationship. What are the signs or symptoms? Stress can cause emotional symptoms, including:  Anxiety. This is feeling worried, afraid, on edge, overwhelmed, or out of control.  Anger, including irritation or impatience.  Depression. This is feeling sad, down, helpless, or guilty.  Trouble focusing, remembering, or making decisions. Stress can cause physical symptoms, including:  Aches and pains. These may affect your head, neck, back, stomach, or other areas of your body.  Tight muscles or a clenched jaw.  Low energy.  Trouble sleeping. Stress can cause unhealthy behaviors, including:  Eating to feel better (overeating) or skipping meals.  Working too much or putting off  tasks.  Smoking, drinking alcohol, or using drugs to feel better. How is this diagnosed? Stress is diagnosed through an assessment by your health care provider. He or she may diagnose this condition based on:  Your symptoms and any stressful life events.  Your medical history.  Tests to rule out other causes of your symptoms. Depending on your condition, your health care provider may refer you to a specialist for further evaluation. How is this treated? Stress management techniques are the recommended treatment for stress. Medicine is not typically recommended for the treatment of stress. Techniques to reduce your reaction to stressful life events include:  Stress identification. Monitor yourself for symptoms of stress and identify what causes stress for you. These skills may help you to avoid or prepare for stressful events.  Time management. Set your priorities, keep a calendar of events, and learn to say no. Taking these actions can help you avoid making too many commitments. Techniques for coping with stress include:  Rethinking the problem. Try to think realistically about stressful events rather than ignoring them or overreacting. Try to find the positives in a stressful situation rather than focusing on the negatives.  Exercise. Physical exercise can release both physical and emotional tension. The key is to find a form of exercise that you enjoy and do it regularly.  Relaxation techniques. These relax the body and mind. The key is to find one or more that you enjoy and use the techniques regularly. Examples include: ? Meditation, deep breathing, or progressive relaxation techniques. ? Yoga or tai chi. ? Biofeedback, mindfulness techniques, or journaling. ? Listening to music, being out in nature, or participating in other hobbies.  Practicing a healthy lifestyle. Eat a balanced diet, drink plenty of water, limit or avoid caffeine, and get plenty of sleep.  Having a strong  support network. Spend time with family, friends, or other people you enjoy being around. Express your feelings and talk things over with someone you trust. Counseling or talk therapy with a mental health professional may be helpful if you are having trouble managing stress on your own.   Follow these  instructions at home: Lifestyle  Avoid drugs.  Do not use any products that contain nicotine or tobacco, such as cigarettes, e-cigarettes, and chewing tobacco. If you need help quitting, ask your health care provider.  Limit alcohol intake to no more than 1 drink a day for nonpregnant women and 2 drinks a day for men. One drink equals 12 oz of beer, 5 oz of wine, or 1 oz of hard liquor  Do not use alcohol or drugs to relax.  Eat a balanced diet that includes fresh fruits and vegetables, whole grains, lean meats, fish, eggs, and beans, and low-fat dairy. Avoid processed foods and foods high in added fat, sugar, and salt.  Exercise at least 30 minutes on 5 or more days each week.  Get 7-8 hours of sleep each night.   General instructions  Practice stress management techniques as discussed with your health care provider.  Drink enough fluid to keep your urine clear or pale yellow.  Take over-the-counter and prescription medicines only as told by your health care provider.  Keep all follow-up visits as told by your health care provider. This is important.   Contact a health care provider if:  Your symptoms get worse.  You have new symptoms.  You feel overwhelmed by your problems and can no longer manage them on your own. Get help right away if:  You have thoughts of hurting yourself or others. If you ever feel like you may hurt yourself or others, or have thoughts about taking your own life, get help right away. You can go to your nearest emergency department or call:  Your local emergency services (911 in the [place]  A suicide crisis helpline, such as the [facility] at 234-703-0283. This is open 24 hours a day. Summary  Stress is a normal reaction to life events. It can cause problems if it happens too often or for too long.  Practicing stress management techniques is the best way to treat stress.  Counseling or talk therapy with a mental health professional may be helpful if you are having trouble managing stress on your own. This information is not intended to replace advice given to you by your health care provider. Make sure you discuss any questions you have with your health care provider. Document Revised: 07/10/2020 Document Reviewed: 07/10/2020 Elsevier Patient Education  2021 [employer].   If you have lab work done today you will be contacted with your lab results within the next 2 weeks.  If you have not heard from [place] then please contact us. The fastest way to get your results is to register for My Chart.   IF you received an x-ray today, you will receive an invoice from [facility]. Please contact [facility] at [phone] with questions or concerns regarding your invoice.   IF you received labwork today, you will receive an invoice from [place]. Please contact [facility] at [phone] with questions or concerns regarding your invoice.   Our billing staff will not be able to assist you with questions regarding bills from these companies.  You will be contacted with the lab results as soon as they are available. The fastest way to get your results is to activate your My Chart account. Instructions are located on the last page of this paperwork. If you have not heard from [place] regarding the results in 2 weeks, please contact this office.      Textbook of family medicine (9th ed., pp. 2334-3568). [place], [place]: [employer].">  Stress, Adult Stress is a normal reaction to life events. Stress is what you feel when life demands more than you are used to, or more than you think you can handle. Some  stress can be useful, such as studying for a test or meeting a deadline at work. Stress that occurs too often or for too long can cause problems. It can affect your emotional health and interfere with relationships and normal daily activities. Too much stress can weaken your body's defense system (immune system) and increase your risk for physical illness. If you already have a medical problem, stress can make it worse. What are the causes? All sorts of life events can cause stress. An event that causes stress for one person may not be stressful for another person. Major life events, whether positive or negative, commonly cause stress. Examples include:  Losing a job or starting a new job.  Losing a loved one.  Moving to a new town or home.  Getting married or divorced.  Having a baby.  Getting injured or sick. Less obvious life events can also cause stress, especially if they occur day after day or in combination with each other. Examples include:  Working long hours.  Driving in traffic.  Caring for children.  Being in debt.  Being in a difficult relationship. What are the signs or symptoms? Stress can cause emotional symptoms, including:  Anxiety. This is feeling worried, afraid, on edge, overwhelmed, or out of control.  Anger, including irritation or impatience.  Depression. This is feeling sad, down, helpless, or guilty.  Trouble focusing, remembering, or making decisions. Stress can cause physical symptoms, including:  Aches and pains. These may affect your head, neck, back, stomach, or other areas of your body.  Tight muscles or a clenched jaw.  Low energy.  Trouble sleeping. Stress can cause unhealthy behaviors, including:  Eating to feel better (overeating) or skipping meals.  Working too much or putting off tasks.  Smoking, drinking alcohol, or using drugs to feel better. How is this diagnosed? Stress is diagnosed through an assessment by your health  care provider. He or she may diagnose this condition based on:  Your symptoms and any stressful life events.  Your medical history.  Tests to rule out other causes of your symptoms. Depending on your condition, your health care provider may refer you to a specialist for further evaluation. How is this treated? Stress management techniques are the recommended treatment for stress. Medicine is not typically recommended for the treatment of stress. Techniques to reduce your reaction to stressful life events include:  Stress identification. Monitor yourself for symptoms of stress and identify what causes stress for you. These skills may help you to avoid or prepare for stressful events.  Time management. Set your priorities, keep a calendar of events, and learn to say no. Taking these actions can help you avoid making too many commitments. Techniques for coping with stress include:  Rethinking the problem. Try to think realistically about stressful events rather than ignoring them or overreacting. Try to find the positives in a stressful situation rather than focusing on the negatives.  Exercise. Physical exercise can release both physical and emotional tension. The key is to find a form of exercise that you enjoy and do it regularly.  Relaxation techniques. These relax the body and mind. The key is to find one or more that you enjoy and use the techniques regularly. Examples include: ? Meditation, deep breathing, or progressive relaxation techniques. ? Yoga or  tai chi. ? Biofeedback, mindfulness techniques, or journaling. ? Listening to music, being out in nature, or participating in other hobbies.  Practicing a healthy lifestyle. Eat a balanced diet, drink plenty of water, limit or avoid caffeine, and get plenty of sleep.  Having a strong support network. Spend time with family, friends, or other people you enjoy being around. Express your feelings and talk things over with someone you  trust. Counseling or talk therapy with a mental health professional may be helpful if you are having trouble managing stress on your own.   Follow these instructions at home: Lifestyle  Avoid drugs.  Do not use any products that contain nicotine or tobacco, such as cigarettes, e-cigarettes, and chewing tobacco. If you need help quitting, ask your health care provider.  Limit alcohol intake to no more than 1 drink a day for nonpregnant women and 2 drinks a day for men. One drink equals 12 oz of beer, 5 oz of wine, or 1 oz of hard liquor  Do not use alcohol or drugs to relax.  Eat a balanced diet that includes fresh fruits and vegetables, whole grains, lean meats, fish, eggs, and beans, and low-fat dairy. Avoid processed foods and foods high in added fat, sugar, and salt.  Exercise at least 30 minutes on 5 or more days each week.  Get 7-8 hours of sleep each night.   General instructions  Practice stress management techniques as discussed with your health care provider.  Drink enough fluid to keep your urine clear or pale yellow.  Take over-the-counter and prescription medicines only as told by your health care provider.  Keep all follow-up visits as told by your health care provider. This is important.   Contact a health care provider if:  Your symptoms get worse.  You have new symptoms.  You feel overwhelmed by your problems and can no longer manage them on your own. Get help right away if:  You have thoughts of hurting yourself or others. If you ever feel like you may hurt yourself or others, or have thoughts about taking your own life, get help right away. You can go to your nearest emergency department or call:  Your local emergency services (911 in the [place]  A suicide crisis helpline, such as the [facility] at (845) 498-4332. This is open 24 hours a day. Summary  Stress is a normal reaction to life events. It can cause problems if it  happens too often or for too long.  Practicing stress management techniques is the best way to treat stress.  Counseling or talk therapy with a mental health professional may be helpful if you are having trouble managing stress on your own. This information is not intended to replace advice given to you by your health care provider. Make sure you discuss any questions you have with your health care provider. Document Revised: 07/10/2020 Document Reviewed: 07/10/2020 Elsevier Patient Education  2021 [place].      Signed, Merri Ray, MD [facility]

## 2020-11-21 ENCOUNTER — Encounter: Payer: Self-pay | Admitting: Family Medicine

## 2020-11-23 ENCOUNTER — Ambulatory Visit: Payer: 59 | Admitting: Family Medicine

## 2020-11-24 ENCOUNTER — Institutional Professional Consult (permissible substitution): Payer: 59 | Admitting: Neurology

## 2020-12-16 ENCOUNTER — Encounter: Payer: Self-pay | Admitting: Neurology

## 2020-12-16 ENCOUNTER — Ambulatory Visit (INDEPENDENT_AMBULATORY_CARE_PROVIDER_SITE_OTHER): Payer: 59 | Admitting: Neurology

## 2020-12-16 VITALS — BP 128/84 | HR 59 | Ht 60.0 in | Wt 141.0 lb

## 2020-12-16 DIAGNOSIS — R635 Abnormal weight gain: Secondary | ICD-10-CM | POA: Diagnosis not present

## 2020-12-16 DIAGNOSIS — G479 Sleep disorder, unspecified: Secondary | ICD-10-CM

## 2020-12-16 DIAGNOSIS — G4733 Obstructive sleep apnea (adult) (pediatric): Secondary | ICD-10-CM

## 2020-12-16 DIAGNOSIS — R351 Nocturia: Secondary | ICD-10-CM

## 2020-12-16 NOTE — Patient Instructions (Signed)
It was nice to see you again today.  You had mild sleep apnea some years ago but have had a little bit of weight gain and since you had some difficulty with blood pressure elevation, it makes sense to recheck your sleep apnea.  Since you did not tolerate CPAP in the past, we can certainly consider other treatment options such as a dental device if the need arises.  As discussed, we will proceed with a home sleep test.  The sleep lab will call you to schedule your pickup date and time and we will call you with the results and take it from there.  Please continue to pursue your healthy lifestyle.  I am glad to hear that your sleep has improved a little bit since you started your dietary changes.  Your blood pressure looked okay today.  We will plan to follow-up according to your sleep test results.

## 2020-12-16 NOTE — Progress Notes (Signed)
Subjective:    Patient ID: Sarah Beasley is a 60 y.o. female.  HPI     Star Age, MD, PhD [facility] [address], [address] [place], [place] [address]  Dear Dr. Carlota Raspberry,  I saw your patient, Sarah Beasley, upon your kind request in my sleep clinic today for reevaluation of her sleep disorder, in particular her prior Dx of obstructive sleep apnea. The patient is unaccompanied today.  I had seen her over 4 years ago for her chronic sleep difficulty. She had a sleep study in 2017 which showed mild OSA for which she was advised to try AutoPap therapy. She missed an appointment in January 2018 and was lost to follow-up since then.  As you know, Sarah Beasley is a 60 year old right-handed woman with an underlying medical history of hypertension, heart murmur, and overweight state, who reports difficulty with sleep maintenance.  Lately however, since she started intermittent fasting, her symptoms have improved and that she sleeps a little bit longer at night, averages about 6 hours.  About a month or 2 before, she averaged about 4 hours of sleep on any given night.  She does have nocturia about once or twice per average night, has had a little bit of weight gain since she was previously tested for sleep apnea.  She could not tolerate PAP therapy, she tried it not even a full month as she recalls and gave up on it.  She would be willing to get rechecked for sleep apnea but currently reports that her sleep related symptoms have actually improved.  Her blood pressure has been better as well.  She does admit to not handling stress as well as in the past.  She lives alone.  She has a bedtime of around 9 and sometimes she is up by 4 AM.  She denies recurrent morning headaches and has not had much in the way of caffeine on a regular basis, averages currently about 1 cup of coffee per day in the mornings.  She has no children, no pets in the house.  She does not know if she  snores, has rarely woken herself up from a snoring type feeling.  I reviewed your office notes from 10/19/20 and 11/20/20. She had had elevated BP values. Her Epworth sleepiness score is 2 out of 24, fatigue severity score is 23 out of 63.  Previously:  06/23/16: 60 year old right-handed woman with an underlying medical history of hypertension, and borderline overweight state, who presents for follow-up consultation of her sleep disturbance, after her recent sleep study. The patient is unaccompanied today. I first met her on 05/26/2016 at the request of her primary care physician, at which time she reported difficulty with her sleep in particular trouble staying asleep, some snoring, and nonrestorative sleep. I invited her back for sleep study. She had a diagnostic sleep study on 06/15/2016. I went over her test results with her in detail today. Sleep efficiency was 52.9% with a latency to sleep of 75.5 minutes and wake after sleep onset of 161 minutes with moderate to severe sleep fragmentation noted. She had an increased percentage of light stage sleep, absence of slow-wave sleep and a decreased percentage of REM sleep at 15.6% with a normal REM latency. She had no significant PLMS, EKG or EEG changes. Mild intermittent snoring was noted area total AHI was 9.7 per hour, rising to 14.5 per hour during REM sleep and 12.5 per hour in the supine position. Average oxygen saturation was  95%, nadir was 91%.   She reports no new symptoms, no worsening or improvement of her sleep disturbance, this dates back many years, she has sleep interruption and nonrestorative sleep, trouble staying asleep primarily, takes Unisom very sparingly. She denies restless leg symptoms.     05/26/2016: She reports difficulty with her sleep, particularly trouble staying asleep. She also reports daytime tiredness and occasional snoring. She has tried over-the-counter p.m. type medication and Unisom with marginal success. She does not  drink much in the way of caffeine, usually just one cup of coffee in the morning. I reviewed your office note from 04/14/2016. She was advised regarding sleep hygiene and insomnia management at the time.   She has been using Unisom as needed especially when she has had a few nights of not good sleep. Overall, she reports a several year history of difficulty staying asleep. It seems to have become worse over the past couple of years. She has reduced her caffeine intake. She lives alone, is single, no children, no pets. She does not smoke cigarettes. She drinks alcohol in the form of wine, mostly on weekends. She drinks one cup of coffee per day typically. She has not been exercising regularly and would like to lose about 10-15 pounds she says. She snores and has been told in the past that snoring can be loud. She denies morning headaches and restless leg symptoms but has nocturia about once or twice per night. She is postmenopausal. She denies any significant stress. Sometimes she drives to [place] or [place] where she is originally from. She has noted some drowsiness at times during meetings or while driving. She has had infrequent episodes of sleep paralysis. She has no history of cataplexy or hypnagogic or hypnopompic hallucinations or sleep attacks. She has some sleep talking and wonders if she has had some sleepwalking as well. She has no family history of obstructive sleep apnea. She tries to be in bed between 8:30 and 9 typically and watches TV in bed and TV tends to stay on all night, sometimes she turns it off when she wakes up in the middle of the night. Wake up time is between 5 and 5:30 AM. She does not wake up rested.   Her Epworth sleepiness score is 10 out of 24 today, her fatigue score is 43 out of 63.  Her Past Medical History Is Significant For: Past Medical History:  Diagnosis Date  . Heart murmur   . Hypertension     Her Past Surgical History Is Significant For: Past Surgical  History:  Procedure Laterality Date  . KNEE SURGERY Left     Her Family History Is Significant For: Family History  Problem Relation Age of Onset  . Hypertension Mother   . Cancer Mother   . Cancer Father   . Diabetes Father   . Heart disease Father   . Hypertension Father   . Hypertension Sister   . Cancer Brother   . Diabetes Brother   . Hypertension Brother   . Hypertension Sister   . Breast cancer Neg Hx     Her Social History Is Significant For: Social History   Socioeconomic History  . Marital status: Single    Spouse name: Not on file  . Number of children: 0  . Years of education: BA  . Highest education level: Not on file  Occupational History  . Not on file  Tobacco Use  . Smoking status: Never Smoker  . Smokeless tobacco: Never  Used  Vaping Use  . Vaping Use: Never used  Substance and Sexual Activity  . Alcohol use: Yes    Alcohol/week: 10.0 standard drinks    Types: 10 Glasses of wine per week    Comment: 10 glasses through out the week  . Drug use: No  . Sexual activity: Not Currently  Other Topics Concern  . Not on file  Social History Narrative   Exercise walking for 1 hour   Drinks 1 cup of coffee in the morning.    Social Determinants of Health   Financial Resource Strain: Not on file  Food Insecurity: Not on file  Transportation Needs: Not on file  Physical Activity: Not on file  Stress: Not on file  Social Connections: Not on file    Her Allergies Are:  No Known Allergies:   Her Current Medications Are:  Outpatient Encounter Medications as of 12/16/2020  Medication Sig  . Ibuprofen (ADVIL PO) Take by mouth.  . losartan (COZAAR) 50 MG tablet Take 1 tablet (50 mg total) by mouth daily.   No facility-administered encounter medications on file as of 12/16/2020.  :  Review of Systems:  Out of a complete 14 point review of systems, all are reviewed and negative with the exception of these symptoms as listed below:   Review of  Systems  Neurological:       Here for sleep consult. Pt reports prior sleep study 5 years ago. States at the time of the study she was borderline for OSA, started cpap for a short period of time but d/c due to intolerance.   Epworth Sleepiness Scale 0= would never doze 1= slight chance of dozing 2= moderate chance of dozing 3= high chance of dozing  Sitting and reading:1 Watching TV:1 Sitting inactive in a public place (ex. Theater or meeting):0 As a passenger in a car for an hour without a break:0 Lying down to rest in the afternoon:0 Sitting and talking to someone:0 Sitting quietly after lunch (no alcohol):0 In a car, while stopped in traffic:0 Total:2     Objective:  Neurological Exam  Physical Exam Physical Examination:   Vitals:   12/16/20 1342  BP: 128/84  Pulse: (!) 59  SpO2: 97%   General Examination: The patient is a very pleasant 60 y.o. female in no acute distress. She appears well-developed and well-nourished and well groomed.   HEENT: Normocephalic, atraumatic, pupils are equal, round and reactive to light, extraocular tracking is good without limitation to gaze excursion or nystagmus noted. Hearing is grossly intact. Face is symmetric with normal facial animation. Speech is clear with no dysarthria noted. There is no hypophonia. There is no lip, neck/head, jaw or voice tremor. Neck is supple with full range of passive and active motion. There are no carotid bruits on auscultation. Oropharynx exam reveals: Mild mouth dryness, good dental hygiene, mild airway crowding secondary to small airway, tonsils on the smaller side, Mallampati class I, neck circumference of 13-1/2 inches.  She has a mild overbite.  Tongue protrudes centrally and palate elevates symmetrically.    Chest: Clear to auscultation without wheezing, rhonchi or crackles noted.  Heart: S1+S2+0, regular and normal without murmurs, rubs or gallops noted.   Abdomen: Soft, non-tender and non-distended  with normal bowel sounds appreciated on auscultation.  Extremities: There is no pitting edema in the distal lower extremities bilaterally.   Skin: Warm and dry without trophic changes noted.   Musculoskeletal: exam reveals no obvious joint deformities, tenderness or joint swelling  or erythema.   Neurologically:  Mental status: The patient is awake, alert and oriented in all 4 spheres. Her immediate and remote memory, attention, language skills and fund of knowledge are appropriate. There is no evidence of aphasia, agnosia, apraxia or anomia. Speech is clear with normal prosody and enunciation. Thought process is linear. Mood is normal and affect is normal.  Cranial nerves II - XII are as described above under HEENT exam.  Motor exam: Normal bulk, strength and tone is noted. There is no tremor. Fine motor skills and coordination: grossly intact.  Cerebellar testing: No dysmetria or intention tremor. There is no truncal or gait ataxia.  Sensory exam: intact to light touch in the upper and lower extremities.  Gait, station and balance: She stands easily. No veering to one side is noted. No leaning to one side is noted. Posture is age-appropriate and stance is narrow based. Gait shows normal stride length and normal pace. No problems turning are noted.   Assessment and Plan:   In summary, Marine Lezotte is a very pleasant 60 y.o.-year old female with an underlying medical history of hypertension, heart murmur, and overweight state, who presents for reevaluation of her prior diagnosis of obstructive sleep apnea.  She had mild sleep apnea per sleep testing in August 2017.  She tried AutoPap therapy but could not tolerate it.  She has had a little bit of weight gain and lately, she has had some difficulty with blood pressure management.  She started intermittent fasting and changed her dietary habits a little bit and feels improved in that her nighttime sleep consolidation is better.  Blood pressure  is good today.  She is encouraged to get rechecked with a home sleep test as she sometimes even a little bit of weight gain can exacerbate sleep apnea.  She is willing to proceed.  Given that she had intolerance to positive airway pressure treatment in the past, we can certainly consider a dental device.  She is advised to continue to work on weight loss.  We talked about the importance of healthy lifestyle.  She is agreeable to pursuing a home sleep test.  We will keep her posted as to her results by phone call and take it from there.  I answered all her questions today and she was in agreement with the plan.  Thank you very much for allowing me to participate in the care of this nice patient. If I can be of any further assistance to you please do not hesitate to call me at [phone].  Sincerely,   Star Age, MD, PhD

## 2020-12-29 ENCOUNTER — Encounter: Payer: Self-pay | Admitting: Gastroenterology

## 2021-01-04 ENCOUNTER — Ambulatory Visit (INDEPENDENT_AMBULATORY_CARE_PROVIDER_SITE_OTHER): Payer: 59 | Admitting: Neurology

## 2021-01-04 DIAGNOSIS — R351 Nocturia: Secondary | ICD-10-CM

## 2021-01-04 DIAGNOSIS — G4733 Obstructive sleep apnea (adult) (pediatric): Secondary | ICD-10-CM | POA: Diagnosis not present

## 2021-01-04 DIAGNOSIS — G479 Sleep disorder, unspecified: Secondary | ICD-10-CM

## 2021-01-04 DIAGNOSIS — R635 Abnormal weight gain: Secondary | ICD-10-CM

## 2021-01-05 ENCOUNTER — Ambulatory Visit: Payer: 59 | Admitting: Cardiovascular Disease

## 2021-01-07 NOTE — Procedures (Signed)
   [facility] TEST (Watch PAT)  STUDY DATE: 01/04/21  DOB: 1961/03/15  MRN: [id number]  ORDERING CLINICIAN: Star Age, MD, PhD   REFERRING CLINICIAN: [name], MD   CLINICAL INFORMATION/HISTORY: 60 year old woman with a history of hypertension, heart murmur, and overweight state, who reports difficulty with sleep maintenance. She presents for reevaluation of her sleep apnea which was mild some years ago.    Epworth sleepiness score: 2/24.  BMI: 27.7 kg/m  FINDINGS:   Total Record Time (hours, min): 8 H 12 min  Total Sleep Time (hours, min):  6 H 29 min   Percent REM (%):    21.04 %   Calculated pAHI (per hour): 22.3       REM pAHI: 43.9   NREM pAHI: 19.1 Supine AHI: N/A   Oxygen Saturation (%) Mean: 94  Minimum oxygen saturation (%):         80   O2 Saturation Range (%): 80-100  O2Saturation (minutes) <=88%: 0.2 min  Pulse Mean (bpm):    51  Pulse Range (39-93)   IMPRESSION: OSA (obstructive sleep apnea)  RECOMMENDATION:  This home sleep test demonstrates moderate obstructive sleep apnea with a total AHI of 22.3/hour and O2 nadir of 80%. Treatment with positive airway pressure is recommended. The patient will be advised to proceed with an autoPAP titration/trial at home for now. A full night titration study may be considered to optimize treatment settings, if needed down the road.  Alternative treatment options may include surgical options or dental treatment.  Please note that untreated obstructive sleep apnea may carry additional perioperative morbidity. Patients with significant obstructive sleep apnea should receive perioperative PAP therapy and the surgeons and particularly the anesthesiologist should be informed of the diagnosis and the severity of the sleep disordered breathing. The patient should be cautioned not to drive, work at heights, or operate dangerous or heavy equipment when tired or sleepy. Review and reiteration of good  sleep hygiene measures should be pursued with any patient. Other causes of the patient's symptoms, including circadian rhythm disturbances, an underlying mood disorder, medication effect and/or an underlying medical problem cannot be ruled out based on this test. Clinical correlation is recommended. The patient and her referring provider will be notified of the test results. The patient will be seen in follow up in sleep clinic at [facility].  I certify that I have reviewed the raw data recording prior to the issuance of this report in accordance with the standards of the American Academy of Sleep Medicine (AASM).  INTERPRETING PHYSICIAN:  Star Age, MD, PhD  Board Certified in Neurology and Sleep Medicine  [facility] [address] [place], [place] [address] [phone]

## 2021-01-07 NOTE — Progress Notes (Signed)
Patient referred by Dr. Carlota Raspberry, seen by me on 12/16/20 for re-eval of her OSA, which was mild before. She had a HST on 01/04/21.    Please call and notify the patient that the recent home sleep test showed obstructive sleep apnea in the moderate range. I recommend treatment in the form of autoPAP, which means, that we don't have to bring her in for a sleep study with CPAP, but will let her start using a so called autoPAP machine at home.  Given that she had prior intolerance to AutoPap therapy, we can certainly consider a dental referral for consideration of a dental device if she would prefer.  The quickest treatment would be in the form of an AutoPap machine, if she wants to retry it, I can send an order to a DME company.  Alternatively we can seek evaluation for surgical options such as the implantable time nerve stimulator called inspire.  Let me know if she would like to start AutoPap therapy or if she would like a referral to dentistry instead. Thanks,   Sarah Age, MD, PhD [facility])

## 2021-01-12 ENCOUNTER — Telehealth: Payer: Self-pay

## 2021-01-12 DIAGNOSIS — G4733 Obstructive sleep apnea (adult) (pediatric): Secondary | ICD-10-CM

## 2021-01-12 NOTE — Telephone Encounter (Signed)
I called pt. No answer, left a message asking pt to call me back.   

## 2021-01-12 NOTE — Telephone Encounter (Signed)
-----   Message from Star Age, MD sent at 01/07/2021  4:50 PM EST ----- Patient referred by Dr. Carlota Raspberry, seen by me on 12/16/20 for re-eval of her OSA, which was mild before. She had a HST on 01/04/21.    Please call and notify the patient that the recent home sleep test showed obstructive sleep apnea in the moderate range. I recommend treatment in the form of autoPAP, which means, that we don't have to bring her in for a sleep study with CPAP, but will let her start using a so called autoPAP machine at home.  Given that she had prior intolerance to AutoPap therapy, we can certainly consider a dental referral for consideration of a dental device if she would prefer.  The quickest treatment would be in the form of an AutoPap machine, if she wants to retry it, I can send an order to a DME company.  Alternatively we can seek evaluation for surgical options such as the implantable time nerve stimulator called inspire.  Let me know if she would like to start AutoPap therapy or if she would like a referral to dentistry instead. Thanks,   Star Age, MD, PhD [facility])

## 2021-01-12 NOTE — Telephone Encounter (Signed)
Order has been sent to [facility].  °

## 2021-01-12 NOTE — Telephone Encounter (Signed)
I placed an order for AutoPap therapy, please process order.  Thank you for talking to the patient, please set up follow-up appointment accordingly once she is able to start AutoPap therapy, she will need to be seen within 31 to 89 days of starting treatment.

## 2021-01-12 NOTE — Addendum Note (Signed)
Addended by: Verlin Grills on: 01/12/2021 03:32 PM   Modules accepted: Orders

## 2021-01-12 NOTE — Addendum Note (Signed)
Addended by: Star Age on: 01/12/2021 04:29 PM   Modules accepted: Orders

## 2021-01-12 NOTE — Telephone Encounter (Addendum)
Pt called me back and we discussed results of sleep study. I advised pt that Dr. Rexene Alberts reviewed their sleep study results and found that pt has moderate OSA. Dr. Rexene Alberts recommends that pt start an autopap for treatement. I reviewed PAP compliance expectations with the pt. Pt is agreeable to starting an auto-PAP. I advised pt that an order will be sent to a DME, [facility], and [facility] will call the pt within about one week after they file with the pt's insurance. [facility] will show the pt how to use the machine, fit for masks, and troubleshoot the auto-PAP if needed. Pt will be called back in 4 weeks to schedule 31-90 f/u, due to PAP shortages. A letter with all of this information in it will be mailed to the pt as a reminder. I verified with the pt that the address we have on file is correct. Pt verbalized understanding of results. Pt had no questions at this time but was encouraged to call back if questions arise. I have sent the order to [facility] and have received confirmation that they have received the order.

## 2021-01-31 NOTE — Progress Notes (Signed)
Advanced Hypertension Clinic Initial Assessment:    Date:  02/01/2021   ID:  Sarah Beasley, DOB 1961/09/04, MRN [id number]  PCP:  [name], MD  Cardiologist:  No primary care provider on file.  Nephrologist:  Referring MD: [name], MD   CC: Hypertension  History of Present Illness:    Sarah Beasley is a 60 y.o. female with a hx of hypertension and OSA here to establish care in the hypertension clinic. She was first diagnosed with hypertension in 2015.  In general her blood pressure has been well-controlled.  When she moved from [place] she stopped taking her medicine and initially her blood pressure was stable.  She has been working with Dr. Nyoka Cowden and losartan was increased from 25 mg to 50 mg to 100 mg without adequate control.  She has a family history of neuroendocrine tumors, therefore referred to advanced hypertension clinic for evaluation of secondary causes.  Her brother was diagnosed with carcinoid tumor which affected his heart.  She does report episodes of flushing.  She has chronic diarrhea that is not necessarily associated with the flushing.  She has had a couple episodes of her blood pressure spiking to the 200s.  When it occurs it makes her feel very lightheaded and weak.  It is not associated with palpitations.  The first time it occurred right after several teeth were removed.  The second time it occurred while working.  She laid down until her symptoms improved.  She had some stress at work at the time but not out of the norm.  Many years ago she was on a trip in [place], [place] and went to the hsopital with very elevated BP.  She reports having a throrough work up without any significant findings.  Lately she has not been getting much exercise because of pain in her foot.  In general she walks for an hour several days per week.  She has no exertional chest pain or shortness of breath.  She denies lower extremity edema, orthopnea, or PND.  She was  recently diagnosed with sleep apnea and is waiting on machine.  Her Fitbit checks her oxygen levels and she notes that it typically does not drop below 90.  She brings a log of her blood pressures at home showing that they have ranged from 10 7-1 36 over 60s to 80s lately.  She notes that her blood pressure is always higher in the doctor's office.  She drinks 1 coffee daily and about 2 bottles of wine per week.  She uses Advil rarely.  She previously saw a cardiologist in [place] due to an abnormal EKG.  She had a nuclear stress test that was negative in 2015.   Previous antihypertensives: None   Past Medical History:  Diagnosis Date  . Essential hypertension 02/01/2021  . Heart murmur   . Hypertension   . OSA (obstructive sleep apnea) 02/01/2021    Past Surgical History:  Procedure Laterality Date  . KNEE SURGERY Left     Current Medications: Current Meds  Medication Sig  . hydrALAZINE (APRESOLINE) 25 MG tablet TAKE AS NEEDED FOR BLOOD PRESSURE ABOVE 160  . Ibuprofen (ADVIL PO) Take by mouth.  . losartan (COZAAR) 50 MG tablet Take 1 tablet (50 mg total) by mouth daily.     Allergies:   Patient has no known allergies.   Social History   Socioeconomic History  . Marital status: Single    Spouse name: Not on  file  . Number of children: 0  . Years of education: BA  . Highest education level: Not on file  Occupational History  . Not on file  Tobacco Use  . Smoking status: Former Smoker    Types: Cigarettes  . Smokeless tobacco: Never Used  Vaping Use  . Vaping Use: Never used  Substance and Sexual Activity  . Alcohol use: Yes    Alcohol/week: 10.0 standard drinks    Types: 10 Glasses of wine per week    Comment: 10 glasses through out the week  . Drug use: No  . Sexual activity: Not Currently  Other Topics Concern  . Not on file  Social History Narrative   Exercise walking for 1 hour   Drinks 1 cup of coffee in the morning.    Social Determinants of Health    Financial Resource Strain: Low Risk   . Difficulty of Paying Living Expenses: Not hard at all  Food Insecurity: Not on file  Transportation Needs: No Transportation Needs  . Lack of Transportation (Medical): No  . Lack of Transportation (Non-Medical): No  Physical Activity: Inactive  . Days of Exercise per Week: 0 days  . Minutes of Exercise per Session: 0 min  Stress: No Stress Concern Present  . Feeling of Stress : Only a little  Social Connections: Not on file     Family History: The patient's family history includes Cancer in her brother, father, and mother; Diabetes in her brother and father; Heart disease in her father, paternal aunt, and paternal uncle; Hypertension in her brother, father, mother, sister, and sister. There is no history of Breast cancer.  ROS:   Please see the history of present illness.     All other systems reviewed and are negative.  EKGs/Labs/Other Studies Reviewed:    EKG:  EKG is ordered today.  The ekg ordered today demonstrates sinus rhythm.  Rate 61 bpm.  Nonspecific ST abnormalities.  Recent Labs: 09/17/2020: ALT 15; Hemoglobin 13.5; Platelets 256 10/19/2020: BUN 11; Creatinine, Ser 0.72; Potassium 4.3; Sodium 138; TSH 1.140   Recent Lipid Panel    Component Value Date/Time   CHOL 283 (H) 09/17/2020 1527   TRIG 416 (H) 09/17/2020 1527   HDL 65 09/17/2020 1527   CHOLHDL 4.4 09/17/2020 1527   LDLCALC 142 (H) 09/17/2020 1527    Physical Exam:   VS:  BP 128/76   Pulse 61   Ht 5' (1.524 m)   Wt 139 lb 12.8 oz (63.4 kg)   BMI 27.30 kg/m  , BMI Body mass index is 27.3 kg/m. GENERAL:  Well appearing HEENT: Pupils equal round and reactive, fundi not visualized, oral mucosa unremarkable NECK:  No jugular venous distention, waveform within normal limits, carotid upstroke brisk and symmetric, no bruits, no thyromegaly LYMPHATICS:  No cervical adenopathy LUNGS:  Clear to auscultation bilaterally HEART:  RRR.  PMI not displaced or  sustained,S1 and S2 within normal limits, no S3, no S4, no clicks, no rubs, no murmurs ABD:  Flat, positive bowel sounds normal in frequency in pitch, no bruits, no rebound, no guarding, no midline pulsatile mass, no hepatomegaly, no splenomegaly EXT:  2 plus pulses throughout, no edema, no cyanosis no clubbing SKIN:  No rashes no nodules NEURO:  Cranial nerves II through XII grossly intact, motor grossly intact throughout PSYCH:  Cognitively intact, oriented to person place and time   ASSESSMENT:    1. Essential hypertension   2. OSA (obstructive sleep apnea)  PLAN:    # Essential hypertension: Blood pressure was initially elevated but better on repeat.  In general it has been well-controlled.  She does have very rare spikes in her blood pressure.  These are not necessarily associated with her flushing episodes or diarrhea.  However given her family history of carcinoid syndrome, it is reasonable to check for this.  We will get a 24-hour urine 5 HIAA.  Continue losartan, which seems to generally control her blood pressure well.  We will give her hydralazine 25 mg as needed to take for spikes over 11/07/1958.  It seems that stress may also be contributing to these episodes.   Disposition:    FU with MD/PharmD in 6 month    Medication Adjustments/Labs and Tests Ordered: Current medicines are reviewed at length with the patient today.  Concerns regarding medicines are outlined above.  Orders Placed This Encounter  Procedures  . 5 HIAA, quantitative, Urine, 24 hour  . EKG 12-Lead   Meds ordered this encounter  Medications  . hydrALAZINE (APRESOLINE) 25 MG tablet    Sig: TAKE AS NEEDED FOR BLOOD PRESSURE ABOVE 160    Dispense:  30 tablet    Refill:  3     Signed, Skeet Latch, MD  02/01/2021 1:08 PM    [facility]

## 2021-02-01 ENCOUNTER — Encounter: Payer: Self-pay | Admitting: Cardiovascular Disease

## 2021-02-01 ENCOUNTER — Other Ambulatory Visit: Payer: Self-pay

## 2021-02-01 ENCOUNTER — Ambulatory Visit (INDEPENDENT_AMBULATORY_CARE_PROVIDER_SITE_OTHER): Payer: 59 | Admitting: Cardiovascular Disease

## 2021-02-01 VITALS — BP 128/76 | HR 61 | Ht 60.0 in | Wt 139.8 lb

## 2021-02-01 DIAGNOSIS — I10 Essential (primary) hypertension: Secondary | ICD-10-CM

## 2021-02-01 DIAGNOSIS — G4733 Obstructive sleep apnea (adult) (pediatric): Secondary | ICD-10-CM | POA: Insufficient documentation

## 2021-02-01 HISTORY — DX: Essential (primary) hypertension: I10

## 2021-02-01 HISTORY — DX: Obstructive sleep apnea (adult) (pediatric): G47.33

## 2021-02-01 MED ORDER — HYDRALAZINE HCL 25 MG PO TABS: ORAL_TABLET | ORAL | 3 refills | Status: DC

## 2021-02-01 NOTE — Patient Instructions (Addendum)
Medication Instructions:  TAKE HYDRALAZINE 25 MG AS NEEDED FOR BLOOD PRESSURE ABOVE 160   Labwork: 24 HOUR URINE    Testing/Procedures: NONE   Follow-Up: 08/04/2021 AT 10:00 AM WITH DR [facility]   24-Hour Urine Collection A 24-hour urine specimen is a lab test that requires collection of all your urine for an entire day. This is sometimes called a timed urine test. It can provide more information than a single urine sample. There are many reasons to have this test. Your health care provider may order the test to check for or monitor the following conditions:  High blood pressure.  Kidney disease.  Kidney stones.  Urinary tract infections.  Pregnancy.  Diabetes. How do I prepare for this test?  You may be asked to follow a special diet during or before the collection period. Follow any instructions from your health care provider. If no special instructions are given, you may eat and drink normally.  Take over-the-counter and prescription medicines only as told by your health care provider.  Let your health care provider know about any medicines that you are taking, including over-the-counter medicines, vitamins, herbs, and supplements.  Choose a collection day when you can be at home or when you have a place to store the urine. All urine must be collected during the testing period. How do I do a 24-hour urine collection?  When you get up in the morning, urinate in the toilet and flush. Write down the time. This will be your start time on the day of collection and your end time on the next morning.  From the start time on, all of your urine should be kept in the collection jug that you received from the lab.  If the jug that is given to you already has liquid in it, that is okay. Do not throw out the liquid or rinse out the jug. Some tests need the liquid to be added to your urine.  Urinate into a specimen container, such as a urinal or pan that sits over the toilet. Pour the  urine from the container into the collection jug. Be careful not to spill any of the urine. Use the equipment provided by the lab.  Do not let any toilet paper or stool (feces) get into the jug. This will contaminate the sample.  Stop collecting your urine 24 hours after you started. Collect the last specimen as close as possible to the end of the 24-hour period.  Keep the jug cool in an ice chest or keep it in the refrigerator during collection.  When the 24-hour collection is complete, bring the jug to the lab. Keep the jug cool in an ice chest while you are bringing it to the lab.   What do the results mean? Talk with your health care provider about what your results mean. Questions to ask your health care provider Ask your health care provider, or the department that is doing the test:  When will my results be ready?  How will I get my results?  What are my treatment options?  What other tests do I need?  What are my next steps? Summary  A 24-hour urine specimen is a lab test that requires collection of all your urine for an entire day.  When you get up in the morning, urinate in the toilet and flush. Write down the time. For the next 24 hours, collect all of your urine in the collection jug that you received from the lab.  Keep  the jug cool while collecting the urine and while bringing it back to the lab.  Take the jug of urine back to the lab as soon as possible after the collection period has ended. This information is not intended to replace advice given to you by your health care provider. Make sure you discuss any questions you have with your health care provider. Document Revised: 07/02/2020 Document Reviewed: 07/02/2020 Elsevier Patient Education  [employer].

## 2021-02-03 ENCOUNTER — Encounter: Payer: Self-pay | Admitting: Gastroenterology

## 2021-02-12 ENCOUNTER — Ambulatory Visit: Payer: Self-pay | Admitting: Family Medicine

## 2021-03-19 ENCOUNTER — Ambulatory Visit: Payer: 59

## 2021-03-29 ENCOUNTER — Other Ambulatory Visit: Payer: Self-pay

## 2021-03-29 ENCOUNTER — Ambulatory Visit (AMBULATORY_SURGERY_CENTER): Payer: Self-pay | Admitting: *Deleted

## 2021-03-29 VITALS — Ht 60.0 in | Wt 138.0 lb

## 2021-03-29 DIAGNOSIS — Z1211 Encounter for screening for malignant neoplasm of colon: Secondary | ICD-10-CM

## 2021-03-29 NOTE — Progress Notes (Signed)

## 2021-04-01 ENCOUNTER — Other Ambulatory Visit: Payer: Self-pay

## 2021-04-01 ENCOUNTER — Encounter: Payer: Self-pay | Admitting: Family Medicine

## 2021-04-01 ENCOUNTER — Ambulatory Visit (INDEPENDENT_AMBULATORY_CARE_PROVIDER_SITE_OTHER): Payer: 59 | Admitting: Family Medicine

## 2021-04-01 VITALS — BP 132/66 | HR 58 | Temp 98.3°F | Resp 16 | Ht 60.0 in | Wt 139.2 lb

## 2021-04-01 DIAGNOSIS — N6331 Unspecified lump in axillary tail of the right breast: Secondary | ICD-10-CM

## 2021-04-01 DIAGNOSIS — R2230 Localized swelling, mass and lump, unspecified upper limb: Secondary | ICD-10-CM

## 2021-04-01 DIAGNOSIS — I10 Essential (primary) hypertension: Secondary | ICD-10-CM

## 2021-04-01 DIAGNOSIS — R222 Localized swelling, mass and lump, trunk: Secondary | ICD-10-CM | POA: Diagnosis not present

## 2021-04-01 LAB — COMPREHENSIVE METABOLIC PANEL
ALT: 15 U/L (ref 0–35)
AST: 16 U/L (ref 0–37)
Albumin: 4.4 g/dL (ref 3.5–5.2)
Alkaline Phosphatase: 72 U/L (ref 39–117)
BUN: 11 mg/dL (ref 6–23)
CO2: 28 mEq/L (ref 19–32)
Calcium: 9.8 mg/dL (ref 8.4–10.5)
Chloride: 103 mEq/L (ref 96–112)
Creatinine, Ser: 0.63 mg/dL (ref 0.40–1.20)
GFR: 97.03 mL/min (ref >60.00)
Glucose, Bld: 84 mg/dL (ref 70–99)
Potassium: 4.1 mEq/L (ref 3.5–5.1)
Sodium: 138 mEq/L (ref 135–145)
Total Bilirubin: 0.6 mg/dL (ref 0.2–1.2)
Total Protein: 7 g/dL (ref 6.0–8.3)

## 2021-04-01 MED ORDER — LOSARTAN POTASSIUM 50 MG PO TABS: 50.0000 mg | ORAL_TABLET | Freq: Every day | ORAL | 1 refills | Status: DC

## 2021-04-01 NOTE — Progress Notes (Signed)
Subjective:  Patient ID: Sarah Beasley, female    DOB: 1961/06/07  Age: 60 y.o. MRN: [id number]  CC:  Chief Complaint  Patient presents with  . Hypertension    Pt denies side effects, pt denies physical sxs.   . Breast Mass    Pt notes lump under Rt arm and into the Rt breast, has been present about 1 month, pt notes no pain, pt notes it is soft tissue.     HPI Sarah Beasley presents for   Hypertension: Treated with losartan 50 mg daily, hydralazine 25 mg as needed.  Has been evaluated by cardiology, Dr. Oval Linsey on 02/01/2021.  Family history of carcinoid syndrome.  24-hour urine 5-HIAA was ordered- had performed last week.   82-month follow-up with MD, Pharm.D. planned. No recent spikes in BP - no hydralazine needed.  OSA diagnosed in March, moderate on HST. autopap machine ordered, but backordered on equipment.  Home readings: 120-130, high of 140 over 70-80.   BP Readings from Last 3 Encounters:  04/01/21 132/66  02/01/21 128/76  12/16/20 128/84   Lab Results  Component Value Date   CREATININE 0.72 10/19/2020   Right breast mass Noted past 1 month, right axilla toward right breast.  Not painful. Feels soft. Unknown if change in size.  [facility], heterogenously dense.  repeat planned in 1 year. No personal hx of breast mass or cyst. Maternal aunt only family member with breast CA.  No recent covid booster.       History Patient Active Problem List   Diagnosis Date Noted  . Essential hypertension 02/01/2021  . OSA (obstructive sleep apnea) 02/01/2021  . Hematuria 07/07/2017   Past Medical History:  Diagnosis Date  . Essential hypertension 02/01/2021  . Heart murmur    past history  . Hypertension   . OSA (obstructive sleep apnea) 02/01/2021  . Sleep apnea    does not wear cpap as of yet. backordered   Past Surgical History:  Procedure Laterality Date  . KNEE SURGERY Left   . MOUTH  SURGERY     No Known Allergies Prior to Admission medications   Medication Sig Start Date End Date Taking? Authorizing Provider  hydrALAZINE (APRESOLINE) 25 MG tablet TAKE AS NEEDED FOR BLOOD PRESSURE ABOVE 160 02/01/21  Yes Skeet Latch, MD  Ibuprofen (ADVIL PO) Take by mouth.   Yes [provider]  losartan (COZAAR) 50 MG tablet Take 1 tablet (50 mg total) by mouth daily. 11/20/20  Yes [name], MD  Multiple Vitamin (MULTIVITAMIN) tablet Take 1 tablet by mouth daily.   Yes [provider]   Social History   Socioeconomic History  . Marital status: Single    Spouse name: Not on file  . Number of children: 0  . Years of education: BA  . Highest education level: Not on file  Occupational History  . Not on file  Tobacco Use  . Smoking status: Former Smoker    Types: Cigarettes  . Smokeless tobacco: Never Used  Vaping Use  . Vaping Use: Never used  Substance and Sexual Activity  . Alcohol use: Yes    Alcohol/week: 10.0 standard drinks    Types: 10 Glasses of wine per week    Comment: 10 glasses through out the week  . Drug use: No  . Sexual activity: Not Currently  Other Topics Concern  . Not on file  Social History Narrative   Exercise walking for  1 hour   Drinks 1 cup of coffee in the morning.    Social Determinants of Health   Financial Resource Strain: Low Risk   . Difficulty of Paying Living Expenses: Not hard at all  Food Insecurity: Not on file  Transportation Needs: No Transportation Needs  . Lack of Transportation (Medical): No  . Lack of Transportation (Non-Medical): No  Physical Activity: Inactive  . Days of Exercise per Week: 0 days  . Minutes of Exercise per Session: 0 min  Stress: No Stress Concern Present  . Feeling of Stress : Only a little  Social Connections: Not on file  Intimate Partner Violence: Not on file    Review of Systems  Constitutional: Negative for fatigue and unexpected weight change.  Respiratory:  Negative for chest tightness and shortness of breath.   Cardiovascular: Negative for chest pain, palpitations and leg swelling.  Gastrointestinal: Negative for abdominal pain and blood in stool.  Neurological: Negative for dizziness, syncope, light-headedness and headaches.     Objective:   Vitals:   04/01/21 1100  BP: 132/66  Pulse: (!) 58  Resp: 16  Temp: 98.3 F (36.8 C)  TempSrc: Temporal  SpO2: 98%  Weight: 139 lb 3.2 oz (63.1 kg)  Height: 5' (1.524 m)     Physical Exam Vitals reviewed. Exam conducted with a chaperone present.  Constitutional:      Appearance: She is well-developed.  HENT:     Head: Normocephalic and atraumatic.  Eyes:     Conjunctiva/sclera: Conjunctivae normal.     Pupils: Pupils are equal, round, and reactive to light.  Neck:     Vascular: No carotid bruit.  Cardiovascular:     Rate and Rhythm: Normal rate and regular rhythm.     Heart sounds: Normal heart sounds.  Pulmonary:     Effort: Pulmonary effort is normal.     Breath sounds: Normal breath sounds.  Chest:  Breasts:     Right: Normal. No swelling, bleeding, inverted nipple, mass, nipple discharge, skin change, tenderness, axillary adenopathy or supraclavicular adenopathy.     Left: Normal. No swelling, bleeding, inverted nipple, mass, nipple discharge, skin change, tenderness, axillary adenopathy or supraclavicular adenopathy.     Abdominal:     Palpations: Abdomen is soft. There is no pulsatile mass.     Tenderness: There is no abdominal tenderness.  Lymphadenopathy:     Upper Body:     Right upper body: No supraclavicular or axillary adenopathy.     Left upper body: No supraclavicular or axillary adenopathy.  Skin:    General: Skin is warm and dry.  Neurological:     Mental Status: She is alert and oriented to person, place, and time.  Psychiatric:        Behavior: Behavior normal.        Assessment & Plan:  Sarah Beasley is a 60 y.o. female . Lump of axillary  tail of right breast Axillary fullness - Plan: US BREAST LTD UNI RIGHT INC AXILLA, MM Digital Diagnostic Unilat R  -Possible lipoma or prominence of soft tissue but will check ultrasound, mammogram as above.  Essential hypertension - Plan: losartan (COZAAR) 50 MG tablet, Comprehensive metabolic panel  -Stable on current dose losartan, results of 5 HIAA screening pending.  Has hydralazine if needed for spikes in blood pressure.  Sleep apnea recently diagnosed, waiting on machine.  Check labs, no med changes for now.  Meds ordered this encounter  Medications  . losartan (COZAAR) 50 MG tablet  Sig: Take 1 tablet (50 mg total) by mouth daily.    Dispense:  90 tablet    Refill:  1   There are no Patient Instructions on file for this visit.    Signed, Merri Ray, MD [facility]

## 2021-04-06 LAB — 5 HIAA, QUANTITATIVE, URINE, 24 HOUR
5-HIAA, Ur: 2.3 mg/L
5-HIAA,Quant.,24 Hr Urine: 4.1 mg/24 hr (ref 0.0–14.9)

## 2021-04-08 ENCOUNTER — Encounter: Payer: Self-pay | Admitting: Certified Registered Nurse Anesthetist

## 2021-04-09 ENCOUNTER — Ambulatory Visit (AMBULATORY_SURGERY_CENTER): Payer: 59 | Admitting: Gastroenterology

## 2021-04-09 ENCOUNTER — Other Ambulatory Visit: Payer: Self-pay

## 2021-04-09 ENCOUNTER — Encounter: Payer: Self-pay | Admitting: Gastroenterology

## 2021-04-09 VITALS — BP 130/72 | HR 66 | Temp 98.7°F | Resp 18 | Ht 60.0 in | Wt 138.0 lb

## 2021-04-09 DIAGNOSIS — K635 Polyp of colon: Secondary | ICD-10-CM | POA: Diagnosis not present

## 2021-04-09 DIAGNOSIS — D127 Benign neoplasm of rectosigmoid junction: Secondary | ICD-10-CM

## 2021-04-09 DIAGNOSIS — Z1211 Encounter for screening for malignant neoplasm of colon: Secondary | ICD-10-CM

## 2021-04-09 MED ORDER — SODIUM CHLORIDE 0.9 % IV SOLN: 500.0000 mL | Freq: Once | INTRAVENOUS | Status: DC

## 2021-04-09 NOTE — Progress Notes (Signed)
Medical history reviewed with no changes noted. VS assessed by C.W 

## 2021-04-09 NOTE — Patient Instructions (Signed)
YOU HAD AN ENDOSCOPIC PROCEDURE TODAY AT [facility]:   Refer to the procedure report that was given to you for any specific questions about what was found during the examination.  If the procedure report does not answer your questions, please call your gastroenterologist to clarify.  If you requested that your care partner not be given the details of your procedure findings, then the procedure report has been included in a sealed envelope for you to review at your convenience later.  YOU SHOULD EXPECT: Some feelings of bloating in the abdomen. Passage of more gas than usual.  Walking can help get rid of the air that was put into your GI tract during the procedure and reduce the bloating. If you had a lower endoscopy (such as a colonoscopy or flexible sigmoidoscopy) you may notice spotting of blood in your stool or on the toilet paper. If you underwent a bowel prep for your procedure, you may not have a normal bowel movement for a few days.  Please Note:  You might notice some irritation and congestion in your nose or some drainage.  This is from the oxygen used during your procedure.  There is no need for concern and it should clear up in a day or so.  SYMPTOMS TO REPORT IMMEDIATELY:   Following lower endoscopy (colonoscopy or flexible sigmoidoscopy):  Excessive amounts of blood in the stool  Significant tenderness or worsening of abdominal pains  Swelling of the abdomen that is new, acute  Fever of 100F or higher    For urgent or emergent issues, a gastroenterologist can be reached at any hour by calling [phone]. Do not use MyChart messaging for urgent concerns.    DIET:  We do recommend a small meal at first, but then you may proceed to your regular diet.  Drink plenty of fluids but you should avoid alcoholic beverages for 24 hours.  ACTIVITY:  You should plan to take it easy for the rest of today and you should NOT DRIVE or use heavy machinery until tomorrow  (because of the sedation medicines used during the test).    FOLLOW UP: Our staff will call the number listed on your records 48-72 hours following your procedure to check on you and address any questions or concerns that you may have regarding the information given to you following your procedure. If we do not reach you, we will leave a message.  We will attempt to reach you two times.  During this call, we will ask if you have developed any symptoms of COVID 19. If you develop any symptoms (ie: fever, flu-like symptoms, shortness of breath, cough etc.) before then, please call [phone].  If you test positive for Covid 19 in the 2 weeks post procedure, please call and report this information to [place].    If any biopsies were taken you will be contacted by phone or by letter within the next 1-3 weeks.  Please call us at [phone] if you have not heard about the biopsies in 3 weeks.    SIGNATURES/CONFIDENTIALITY: You and/or your care partner have signed paperwork which will be entered into your electronic medical record.  These signatures attest to the fact that that the information above on your After Visit Summary has been reviewed and is understood.  Full responsibility of the confidentiality of this discharge information lies with you and/or your care-partner.   Recommend FiberCon 1-2 tablets daily and High Fiber diet, perineal  Rash desitin or equivalent.Resume  medications. Information given on polyps and hemorrhoids.

## 2021-04-09 NOTE — Progress Notes (Signed)
Report given to PACU, vss 

## 2021-04-09 NOTE — Op Note (Signed)
[facility] Patient Name: Sarah Beasley Procedure Date: 04/09/2021 11:57 AM MRN: [id number] Endoscopist: Justice Britain , MD Age: 60 Referring MD:  Date of Birth: [birth year] Gender: Female Account #: [id number] Procedure:                Colonoscopy Indications:              Screening for colorectal malignant neoplasm, This                            is the patient's first colonoscopy Medicines:                Monitored Anesthesia Care Procedure:                Pre-Anesthesia Assessment:                           - Prior to the procedure, a History and Physical                            was performed, and patient medications and                            allergies were reviewed. The patient's tolerance of                            previous anesthesia was also reviewed. The risks                            and benefits of the procedure and the sedation                            options and risks were discussed with the patient.                            All questions were answered, and informed consent                            was obtained. Prior Anticoagulants: The patient has                            taken no previous anticoagulant or antiplatelet                            agents except for NSAID medication. ASA Grade                            Assessment: II - A patient with mild systemic                            disease. After reviewing the risks and benefits,                            the patient was deemed in satisfactory condition to  undergo the procedure.                           After obtaining informed consent, the colonoscope                            was passed under direct vision. Throughout the                            procedure, the patient's blood pressure, pulse, and                            oxygen saturations were monitored continuously. The                            Olympus PFC-[id number] (#[id number]) Colonoscope  was                            introduced through the anus and advanced to the 5                            cm into the ileum. The colonoscopy was performed                            without difficulty. The patient tolerated the                            procedure. The quality of the bowel preparation was                            good. The ileocecal valve, appendiceal orifice, and                            rectum were photographed. Scope In: 12:08:00 PM Scope Out: 12:18:51 PM Scope Withdrawal Time: 0 hours 7 minutes 39 seconds  Total Procedure Duration: 0 hours 10 minutes 51 seconds  Findings:                 The perianal exam findings include a perianal rash.                           The digital rectal exam findings include                            hemorrhoids. Pertinent negatives include no                            palpable rectal lesions.                           Two sessile polyps were found in the recto-sigmoid                            colon. The polyps were 2 to 3 mm in size. These  polyps were removed with a cold snare. Resection                            and retrieval were complete.                           Normal mucosa was found in the entire colon                            otherwise.                           Non-bleeding non-thrombosed internal hemorrhoids                            were found during retroflexion, during perianal                            exam and during digital exam. The hemorrhoids were                            Grade II (internal hemorrhoids that prolapse but                            reduce spontaneously). Complications:            No immediate complications. Estimated Blood Loss:     Estimated blood loss was minimal. Impression:               - Perianal rash found on perianal exam.                           - Hemorrhoids found on digital rectal exam.                           - Two 2 to 3 mm polyps at the  recto-sigmoid colon,                            removed with a cold snare. Resected and retrieved.                           - Normal mucosa in the entire examined colon                            otherwise.                           - Non-bleeding non-thrombosed internal hemorrhoids. Recommendation:           - The patient will be observed post-procedure,                            until all discharge criteria are met.                           - Discharge patient to home.                           -  Patient has a contact number available for                            emergencies. The signs and symptoms of potential                            delayed complications were discussed with the                            patient. Return to normal activities tomorrow.                            Written discharge instructions were provided to the                            patient.                           - High fiber diet.                           - Use FiberCon 1-2 tablets PO daily.                           - Continue present medications.                           - Await pathology results.                           - Repeat colonoscopy in 03/13/09 years for                            surveillance based on pathology results.                           - For the perineal rash, would recommend holding on                            any creams with steroids for now. Trial wet wipe                            and then air dry vs dry toilet paper to dry the                            perineum. Would then place on dry skin 2-3 times                            per day Barrier Cream (Desitin or Equivalent). If                            after a few weeks this persists, may need                            anti-fungal  treatment which could be discussed with                            PCP if necessary.                           - The findings and recommendations were discussed                             with the patient. Justice Britain, MD 04/09/2021 12:25:57 PM

## 2021-04-09 NOTE — Progress Notes (Signed)
Called to room to assist during endoscopic procedure.  Patient ID and intended procedure confirmed with present staff. Received instructions for my participation in the procedure from the performing physician.  

## 2021-04-13 ENCOUNTER — Telehealth: Payer: Self-pay

## 2021-04-13 NOTE — Telephone Encounter (Signed)
First attempt follow up call to pt, no answer. 

## 2021-04-13 NOTE — Telephone Encounter (Signed)
  Follow up Call-  Call back number 04/09/2021  Post procedure Call Back phone  # [phone]  Permission to leave phone message Yes  Some recent data might be hidden     Patient questions:  Do you have a fever, pain , or abdominal swelling? No. Pain Score  0 *  Have you tolerated food without any problems? Yes.    Have you been able to return to your normal activities? Yes.    Do you have any questions about your discharge instructions: Diet   No. Medications  No. Follow up visit  No.  Do you have questions or concerns about your Care? No.  Actions: * If pain score is 4 or above: No action needed, pain <4.   1. Have you developed a fever since your procedure? no  2.   Have you had an respiratory symptoms (SOB or cough) since your procedure? no  3.   Have you tested positive for COVID 19 since your procedure no  4.   Have you had any family members/close contacts diagnosed with the COVID 19 since your procedure? no   If yes to any of these questions please route to Joylene John, RN and Joella Prince, RN

## 2021-04-16 ENCOUNTER — Encounter: Payer: Self-pay | Admitting: Gastroenterology

## 2021-04-26 ENCOUNTER — Emergency Department (INDEPENDENT_AMBULATORY_CARE_PROVIDER_SITE_OTHER)
Admission: EM | Admit: 2021-04-26 | Discharge: 2021-04-26 | Disposition: A | Discharge status: Home / Self Care | Payer: 59 | Source: Home / Self Care

## 2021-04-26 ENCOUNTER — Ambulatory Visit (HOSPITAL_COMMUNITY): Admit: 2021-04-26 | Payer: 59

## 2021-04-26 ENCOUNTER — Encounter: Payer: Self-pay | Admitting: Emergency Medicine

## 2021-04-26 ENCOUNTER — Other Ambulatory Visit: Payer: Self-pay

## 2021-04-26 DIAGNOSIS — W19XXXA Unspecified fall, initial encounter: Secondary | ICD-10-CM

## 2021-04-26 DIAGNOSIS — S51001A Unspecified open wound of right elbow, initial encounter: Secondary | ICD-10-CM | POA: Diagnosis not present

## 2021-04-26 DIAGNOSIS — S61401A Unspecified open wound of right hand, initial encounter: Secondary | ICD-10-CM | POA: Diagnosis not present

## 2021-04-26 NOTE — ED Provider Notes (Signed)
[name]    CSN: [id number] Arrival date & time: 04/26/21  1434      History   Chief Complaint Chief Complaint  Patient presents with   Fall    HPI Sarah Beasley is a 60 y.o. female.   HPI 60 year old female presents for fall while walking this morning.  Reports abrasions to hands and right elbow.  After falling she returned home and clean affected areas with antibacterial wipes, soap, and water then showered washing affected areas again with soap and water.  Past Medical History:  Diagnosis Date   Essential hypertension 02/01/2021   Heart murmur    past history   Hypertension    OSA (obstructive sleep apnea) 02/01/2021   Sleep apnea    does not wear cpap as of yet. backordered    Patient Active Problem List   Diagnosis Date Noted   Essential hypertension 02/01/2021   OSA (obstructive sleep apnea) 02/01/2021   Hematuria 07/07/2017    Past Surgical History:  Procedure Laterality Date   KNEE SURGERY Left    MOUTH SURGERY      OB History   No obstetric history on file.      Home Medications    Prior to Admission medications   Medication Sig Start Date End Date Taking? Authorizing Provider  hydrALAZINE (APRESOLINE) 25 MG tablet TAKE AS NEEDED FOR BLOOD PRESSURE ABOVE 160 Patient not taking: Reported on 04/09/2021 02/01/21   Skeet Latch, MD  Ibuprofen (ADVIL PO) Take by mouth. Patient not taking: Reported on 04/09/2021    [provider]  losartan (COZAAR) 50 MG tablet Take 1 tablet (50 mg total) by mouth daily. 04/01/21   [name], MD  Multiple Vitamin (MULTIVITAMIN) tablet Take 1 tablet by mouth daily.    [provider]    Family History Family History  Problem Relation Age of Onset   Hypertension Mother    Cancer Mother    Cancer Father    Diabetes Father    Heart disease Father    Hypertension Father    Hypertension Sister    Cancer Brother        NET carcinoid tumor   Diabetes Brother     Hypertension Brother    Hypertension Sister    Heart disease Paternal Aunt    Heart disease Paternal Uncle    Breast cancer Neg Hx    Colon cancer Neg Hx    Esophageal cancer Neg Hx    Rectal cancer Neg Hx    Stomach cancer Neg Hx     Social History Social History   Tobacco Use   Smoking status: Former    Pack years: 0.00    Types: Cigarettes   Smokeless tobacco: Never  Vaping Use   Vaping Use: Never used  Substance Use Topics   Alcohol use: Yes    Alcohol/week: 10.0 standard drinks    Types: 10 Glasses of wine per week    Comment: 10 glasses through out the week   Drug use: No     Allergies   Patient has no known allergies.   Review of Systems Review of Systems  Constitutional: Negative.   HENT: Negative.    Eyes: Negative.   Respiratory: Negative.    Cardiovascular: Negative.   Gastrointestinal: Negative.   Musculoskeletal:        Fall earlier this morning while walking  Skin:        Skin abrasions of hands and right elbow  Neurological: Negative.  Physical Exam Triage Vital Signs ED Triage Vitals  Enc Vitals Group     BP 04/26/21 1501 (!) 145/89     Pulse Rate 04/26/21 1501 (!) 2     Resp 04/26/21 1501 16     Temp 04/26/21 1501 98.7 F (37.1 C)     Temp Source 04/26/21 1501 Oral     SpO2 04/26/21 1501 99 %     Weight --      Height --      Head Circumference --      Peak Flow --      Pain Score 04/26/21 1502 4     Pain Loc --      Pain Edu? --      Excl. in [place]? --    No data found.  Updated Vital Signs BP (!) 145/89 (BP Location: Right Arm)   Pulse (!) 2   Temp 98.7 F (37.1 C) (Oral)   Resp 16   LMP 03/29/2021   SpO2 99%      Physical Exam Vitals and nursing note reviewed.  Constitutional:      General: She is not in acute distress.    Appearance: Normal appearance. She is normal weight. She is not ill-appearing.  HENT:     Head: Normocephalic and atraumatic.     Mouth/Throat:     Mouth: Mucous membranes are moist.      Pharynx: Oropharynx is clear.  Eyes:     Extraocular Movements: Extraocular movements intact.     Conjunctiva/sclera: Conjunctivae normal.     Pupils: Pupils are equal, round, and reactive to light.  Cardiovascular:     Rate and Rhythm: Normal rate and regular rhythm.     Pulses: Normal pulses.     Heart sounds: Normal heart sounds.  Pulmonary:     Effort: Pulmonary effort is normal.     Breath sounds: Normal breath sounds.     Comments: No adventitious breath sounds noted Musculoskeletal:        General: Normal range of motion.     Cervical back: Normal range of motion and neck supple. No tenderness.  Lymphadenopathy:     Cervical: No cervical adenopathy.  Skin:    Comments: Right hand (palmar aspect):~ 1.0 cm x 0.5 cm ovoid shaped skin avulsion, mildly erythematous with mild soft tissue swelling noted, no foreign body noted, no bleeding, no drainage.    Right elbow (lateral aspect over olecranon):~ 1.0 cm x 1.0 cm circular shaped skin avulsion, erythematous, no foreign bodies noted, no bleeding no discharge, no signs of infection  Neurological:     General: No focal deficit present.     Mental Status: She is alert and oriented to person, place, and time.  Psychiatric:        Mood and Affect: Mood normal.        Behavior: Behavior normal.     UC Treatments / Results  Labs (all labs ordered are listed, but only abnormal results are displayed) Labs Reviewed - No data to display  EKG   Radiology No results found.  Procedures Procedures (including critical care time)  Medications Ordered in UC Medications - No data to display  Initial Impression / Assessment and Plan / UC Course  I have reviewed the triage vital signs and the nursing notes.  Pertinent labs & imaging results that were available during my care of the patient were reviewed by me and considered in my medical decision making (see chart for details).  MDM: 1.  Fall initial encounter, 2. Open wound  of right hand, 3.  Open wound of right elbow. Final Clinical Impressions(s) / UC Diagnoses   Final diagnoses:  Fall, initial encounter  Open wound of right hand without foreign body, unspecified wound type, initial encounter  Open wound of right elbow, initial encounter     Discharge Instructions      Advised/instructed patient to keep wounds of right hand (palmar surface) and right elbow clean, dry and free of debris.  Advised patient to leave wound areas open to air to allow wounds to heal by secondary intention promoting scab formation.  Advised patient if signs or symptoms of infection evolve to please follow-up with PCP or here for further evaluation.     ED Prescriptions   None    PDMP not reviewed this encounter.   Eliezer Lofts, [place] 04/26/21 [phone]

## 2021-04-26 NOTE — Discharge Instructions (Addendum)
Advised/instructed patient to keep wounds of right hand (palmar surface) and right elbow clean, dry and free of debris.  Advised patient to leave wound areas open to air to allow wounds to heal by secondary intention promoting scab formation.  Advised patient if signs or symptoms of infection evolve to please follow-up with PCP or here for further evaluation.

## 2021-04-26 NOTE — ED Triage Notes (Signed)
Pt fell this morning while walking - abrasions to hands & right elbow Cleaned at home on return w/ antibacterial wipes and soap, showered after ward Neosporin and bandaids  Denies any LOC

## 2021-04-28 ENCOUNTER — Other Ambulatory Visit: Payer: Self-pay

## 2021-04-28 ENCOUNTER — Ambulatory Visit
Admission: RE | Admit: 2021-04-28 | Discharge: 2021-04-28 | Disposition: A | Discharge status: Home / Self Care | Payer: 59 | Source: Ambulatory Visit | Attending: Family Medicine | Admitting: Family Medicine

## 2021-04-28 DIAGNOSIS — R2230 Localized swelling, mass and lump, unspecified upper limb: Secondary | ICD-10-CM

## 2021-04-29 ENCOUNTER — Emergency Department (INDEPENDENT_AMBULATORY_CARE_PROVIDER_SITE_OTHER)
Admission: RE | Admit: 2021-04-29 | Discharge: 2021-04-29 | Disposition: A | Discharge status: Home / Self Care | Payer: 59 | Source: Ambulatory Visit | Attending: Family Medicine | Admitting: Family Medicine

## 2021-04-29 ENCOUNTER — Other Ambulatory Visit: Payer: Self-pay | Admitting: Cardiovascular Disease

## 2021-04-29 ENCOUNTER — Other Ambulatory Visit: Payer: Self-pay

## 2021-04-29 VITALS — BP 137/80 | HR 66 | Temp 98.7°F | Resp 20 | Ht 60.0 in | Wt 137.0 lb

## 2021-04-29 DIAGNOSIS — Z5189 Encounter for other specified aftercare: Secondary | ICD-10-CM | POA: Diagnosis not present

## 2021-04-29 MED ORDER — MUPIROCIN 2 % EX OINT: TOPICAL_OINTMENT | CUTANEOUS | 0 refills | Status: DC

## 2021-04-29 NOTE — ED Provider Notes (Signed)
[name]    CSN: [id number] Arrival date & time: 04/29/21  1056      History   Chief Complaint Chief Complaint  Patient presents with   Wound Check    HPI Sarah Beasley is a 60 y.o. female.   Patient was treated here three days ago for minor abrasions on her right elbow and right hand.  She believes that the injuries are healing but is concerned about an area on her right palm.  The history is provided by the patient.   Past Medical History:  Diagnosis Date   Essential hypertension 02/01/2021   Heart murmur    past history   Hypertension    OSA (obstructive sleep apnea) 02/01/2021   Sleep apnea    does not wear cpap as of yet. backordered    Patient Active Problem List   Diagnosis Date Noted   Essential hypertension 02/01/2021   OSA (obstructive sleep apnea) 02/01/2021   Hematuria 07/07/2017    Past Surgical History:  Procedure Laterality Date   KNEE SURGERY Left    MOUTH SURGERY      OB History   No obstetric history on file.      Home Medications    Prior to Admission medications   Medication Sig Start Date End Date Taking? Authorizing Provider  mupirocin ointment (BACTROBAN) 2 % Apply to wound 2 to 3 times daily 04/29/21  Yes Paetyn Pietrzak, Ishmael Holter, MD  hydrALAZINE (APRESOLINE) 25 MG tablet TAKE 1 TABLET BY MOUTH AS NEEDED FOR BLOOD PRESSURE ABOVE 160 04/29/21   Skeet Latch, MD  Ibuprofen (ADVIL PO) Take by mouth. Patient not taking: Reported on 04/09/2021    [provider]  losartan (COZAAR) 50 MG tablet Take 1 tablet (50 mg total) by mouth daily. 04/01/21   [name], MD  Multiple Vitamin (MULTIVITAMIN) tablet Take 1 tablet by mouth daily.    [provider]    Family History Family History  Problem Relation Age of Onset   Hypertension Mother    Cancer Mother    Cancer Father    Diabetes Father    Heart disease Father    Hypertension Father    Hypertension Sister    Cancer Brother        NET  carcinoid tumor   Diabetes Brother    Hypertension Brother    Hypertension Sister    Heart disease Paternal Aunt    Heart disease Paternal Uncle    Breast cancer Neg Hx    Colon cancer Neg Hx    Esophageal cancer Neg Hx    Rectal cancer Neg Hx    Stomach cancer Neg Hx     Social History Social History   Tobacco Use   Smoking status: Former    Pack years: 0.00    Types: Cigarettes   Smokeless tobacco: Never  Vaping Use   Vaping Use: Never used  Substance Use Topics   Alcohol use: Yes    Alcohol/week: 10.0 standard drinks    Types: 10 Glasses of wine per week    Comment: 10 glasses through out the week   Drug use: No     Allergies   Patient has no known allergies.   Review of Systems Review of Systems  Constitutional:  Negative for chills, diaphoresis, fatigue and fever.  Skin:  Positive for wound. Negative for color change and rash.  All other systems reviewed and are negative.   Physical Exam Triage Vital Signs ED Triage Vitals  Enc Vitals Group     BP 04/29/21 1123 137/80     Pulse Rate 04/29/21 1123 66     Resp 04/29/21 1123 20     Temp 04/29/21 1123 98.7 F (37.1 C)     Temp Source 04/29/21 1123 Oral     SpO2 04/29/21 1123 100 %     Weight 04/29/21 1121 137 lb (62.1 kg)     Height 04/29/21 1121 5' (1.524 m)     Head Circumference --      Peak Flow --      Pain Score 04/29/21 1121 5     Pain Loc --      Pain Edu? --      Excl. in [place]? --    No data found.  Updated Vital Signs BP 137/80 (BP Location: Right Arm)   Pulse 66   Temp 98.7 F (37.1 C) (Oral)   Resp 20   Ht 5' (1.524 m)   Wt 62.1 kg   LMP 03/29/2021   SpO2 100%   BMI 26.76 kg/m   Visual Acuity Right Eye Distance:   Left Eye Distance:   Bilateral Distance:    Right Eye Near:   Left Eye Near:    Bilateral Near:     Physical Exam Vitals and nursing note reviewed.  Constitutional:      General: She is not in acute distress. HENT:     Head: Normocephalic.  Eyes:      Pupils: Pupils are equal, round, and reactive to light.  Cardiovascular:     Rate and Rhythm: Normal rate.  Pulmonary:     Effort: Pulmonary effort is normal.  Musculoskeletal:       Hands:     Comments: Right hand palmar surface has a 1cm by 0.5cm healing superficial abrasion with minimal erythema.  No drainage or tenderness to palpation.  Other previously noted wound has normal eschar present.  Skin:    General: Skin is warm and dry.     Findings: No rash.  Neurological:     Mental Status: She is alert and oriented to person, place, and time.     UC Treatments / Results  Labs (all labs ordered are listed, but only abnormal results are displayed) Labs Reviewed - No data to display  EKG   Radiology MM DIAG BREAST TOMO UNI RIGHT  Result Date: 04/28/2021 CLINICAL DATA:  Patient complains of palpable abnormality in her right axilla. EXAM: DIGITAL DIAGNOSTIC UNILATERAL RIGHT MAMMOGRAM WITH TOMOSYNTHESIS AND CAD; [place] AXILLARY RIGHT TECHNIQUE: Right digital diagnostic mammography and breast tomosynthesis was performed. The images were evaluated with computer-aided detection.; Targeted ultrasound examination of the right axilla was performed. COMPARISON:  Previous exam(s). ACR Breast Density Category b: There are scattered areas of fibroglandular density. FINDINGS: No suspicious mass, malignant type microcalcifications or distortion detected in the right breast. Spot tangential view of the axilla shows no suspicious mass. On physical exam, I do not palpate a discrete mass in the right axilla. Targeted ultrasound is performed, showing normal lymph nodes in the right axilla. No suspicious mass, distortion or abnormal shadowing detected. IMPRESSION: No evidence of malignancy in the right breast or right axilla. RECOMMENDATION: Bilateral screening mammogram in December of 2022 is recommended. I have discussed the findings and recommendations with the patient. If applicable, a reminder letter will be  sent to the patient regarding the next appointment. BI-RADS CATEGORY  1: Negative. Electronically Signed   By: Lillia Mountain M.D.   On: 04/28/2021  13:40  [place] AXILLA RIGHT  Result Date: 04/28/2021 CLINICAL DATA:  Patient complains of palpable abnormality in her right axilla. EXAM: DIGITAL DIAGNOSTIC UNILATERAL RIGHT MAMMOGRAM WITH TOMOSYNTHESIS AND CAD; [place] AXILLARY RIGHT TECHNIQUE: Right digital diagnostic mammography and breast tomosynthesis was performed. The images were evaluated with computer-aided detection.; Targeted ultrasound examination of the right axilla was performed. COMPARISON:  Previous exam(s). ACR Breast Density Category b: There are scattered areas of fibroglandular density. FINDINGS: No suspicious mass, malignant type microcalcifications or distortion detected in the right breast. Spot tangential view of the axilla shows no suspicious mass. On physical exam, I do not palpate a discrete mass in the right axilla. Targeted ultrasound is performed, showing normal lymph nodes in the right axilla. No suspicious mass, distortion or abnormal shadowing detected. IMPRESSION: No evidence of malignancy in the right breast or right axilla. RECOMMENDATION: Bilateral screening mammogram in December of 2022 is recommended. I have discussed the findings and recommendations with the patient. If applicable, a reminder letter will be sent to the patient regarding the next appointment. BI-RADS CATEGORY  1: Negative. Electronically Signed   By: Lillia Mountain M.D.   On: 04/28/2021 13:40   Procedures Procedures (including critical care time)  Medications Ordered in UC Medications - No data to display  Initial Impression / Assessment and Plan / UC Course  I have reviewed the triage vital signs and the nursing notes.  Pertinent labs & imaging results that were available during my care of the patient were reviewed by me and considered in my medical decision making (see chart for details).    All wounds healing  well except abrasion right hand:  possible mild early cellulitis.  Begin mupirocin ointment to right hand wound. Followup with Family Doctor if not improved in one week.   Final Clinical Impressions(s) / UC Diagnoses   Final diagnoses:  Visit for wound check     Discharge Instructions      Change dressing daily and apply mupirocin ointment to wound.  Keep wound clean and dry.  Return for any signs of infection (or follow-up with family doctor):  Increasing redness, swelling, pain, heat, drainage, etc.       ED Prescriptions     Medication Sig Dispense Auth. Provider   mupirocin ointment (BACTROBAN) 2 % Apply to wound 2 to 3 times daily 15 g Kandra Nicolas, MD         Kandra Nicolas, MD 05/01/21 [phone]

## 2021-04-29 NOTE — Discharge Instructions (Addendum)
Change dressing daily and apply mupirocin ointment to wound.  Keep wound clean and dry.  Return for any signs of infection (or follow-up with family doctor):  Increasing redness, swelling, pain, heat, drainage, etc.    

## 2021-04-29 NOTE — ED Triage Notes (Signed)
Pt presents to Urgent Care for recheck of wounds she sustained from a fall 3 days ago. Pt reports falling on gravel and has abrasions on both hands and R elbow. No s/s of infection noted.

## 2021-06-02 ENCOUNTER — Other Ambulatory Visit: Payer: Self-pay | Admitting: Family Medicine

## 2021-06-02 DIAGNOSIS — I10 Essential (primary) hypertension: Secondary | ICD-10-CM

## 2021-07-06 ENCOUNTER — Telehealth: Payer: Self-pay | Admitting: *Deleted

## 2021-07-06 NOTE — Telephone Encounter (Signed)
Received a fax from [employer] stating the patient has been set up on the diabetes machine 2022.  Patient will need an appointment 31 to 89 days after set up.  I called the patient and LVM (ok per DPR) asking for call back to get her scheduled for the appointment.  When she calls back please schedule her either with Dr. Rexene Alberts or Jinny Blossom NP between the following dates: 07/31/21-09/27/21.  Please ask her to bring her machine and power cord to the appointment to make sure we are able to download data and label the appt note "initial PAP; [place]; patient to bring machine & power cord"

## 2021-08-03 NOTE — Progress Notes (Signed)
Advanced Hypertension Clinic Follow-up:    Date:  08/04/2021   ID:  Sarah Beasley, DOB 1960-11-15, MRN [id number]  PCP:  [name], MD  Cardiologist:  None  Nephrologist:  Referring MD: [name], MD   CC: Hypertension  History of Present Illness:    Sarah Beasley is a 60 y.o. female with a hx of hypertension and OSA here for follow-up. She established care in the hypertension clinic 02/01/2021. She was first diagnosed with hypertension in 2015. She had a nuclear stress test (2015) that was negative. In general her blood pressure has been well-controlled.  When she moved from [place] she stopped taking her medicine and initially her blood pressure was stable.  She has been working with Dr. Nyoka Cowden and losartan was increased from 25 mg to 50 mg to 100 mg without adequate control.  She has a family history of neuroendocrine tumors (carcinoid), therefore referred to advanced hypertension clinic for evaluation of secondary causes.   At her last appointment, [name] reported a couple episodes of spiking blood pressures up to the 200s, causing lightheadedness and weakness. However, her blood pressure was more controlled at home. She was also waiting on a CPAP. At her initial visit her blood pressure was 128/76. Urine 5-HIAA was within normal limits. She was given hydralazine as needed for spikes in her blood pressure.  Today, she is feeling well overall with no new complaints. At home her blood pressure has averaged 120-130s/80s. Since her last visit, she had a few episodes of spiking blood pressures, but she has not needed to take an extra antihypertensive. When this occurs she "doesn't feel right", and will sit down and check her blood pressure several times. She denies associated lightheadedness. Her heart rate has been averaging in the 50-60s, and she has noticed it as low as the 40s in the night. Currently she is working from home. For exercise she is walking  regularly, but had difficulty during the hot weather. She plans to increase this with the cooling temperatures. Her diet is improving, she recently started Weight Watchers and is focusing on smaller portions and more vegetables. Generally she does not eat much meat. She is not a smoker. She denies any palpitations, chest pain, or shortness of breath. No headaches, syncope, orthopnea, or PND. Also has no lower extremity edema or exertional symptoms. Her 10-year risk score is 5.2%.  Previous antihypertensives: None   Past Medical History:  Diagnosis Date   Bradycardia 08/04/2021   Essential hypertension 02/01/2021   Heart murmur    past history   Hypertension    OSA (obstructive sleep apnea) 02/01/2021   Sleep apnea    does not wear cpap as of yet. backordered    Past Surgical History:  Procedure Laterality Date   KNEE SURGERY Left    MOUTH SURGERY      Current Medications: Current Meds  Medication Sig   hydrALAZINE (APRESOLINE) 25 MG tablet TAKE 1 TABLET BY MOUTH AS NEEDED FOR BLOOD PRESSURE ABOVE 160   Ibuprofen (ADVIL PO) Take by mouth.   losartan (COZAAR) 50 MG tablet TAKE 1 TABLET BY MOUTH EVERY DAY   Multiple Vitamin (MULTIVITAMIN) tablet Take 1 tablet by mouth daily.     Allergies:   Patient has no known allergies.   Social History   Socioeconomic History   Marital status: Single    Spouse name: Not on file   Number of children: 0   Years of education: BA  Highest education level: Not on file  Occupational History   Not on file  Tobacco Use   Smoking status: Former    Types: Cigarettes   Smokeless tobacco: Never  Vaping Use   Vaping Use: Never used  Substance and Sexual Activity   Alcohol use: Yes    Alcohol/week: 10.0 standard drinks    Types: 10 Glasses of wine per week    Comment: 10 glasses through out the week   Drug use: No   Sexual activity: Not Currently  Other Topics Concern   Not on file  Social History Narrative   Exercise walking for 1 hour    Drinks 1 cup of coffee in the morning.    Social Determinants of Health   Financial Resource Strain: Low Risk    Difficulty of Paying Living Expenses: Not hard at all  Food Insecurity: No Food Insecurity   Worried About Charity fundraiser in the Last Year: Never true   [place] in the Last Year: Never true  Transportation Needs: No Transportation Needs   Lack of Transportation (Medical): No   Lack of Transportation (Non-Medical): No  Physical Activity: Inactive   Days of Exercise per Week: 0 days   Minutes of Exercise per Session: 0 min  Stress: No Stress Concern Present   Feeling of Stress : Only a little  Social Connections: Not on file     Family History: The patient's family history includes Cancer in her brother, father, and mother; Diabetes in her brother and father; Heart disease in her father, paternal aunt, and paternal uncle; Hypertension in her brother, father, mother, sister, and sister. There is no history of Breast cancer, Colon cancer, Esophageal cancer, Rectal cancer, or Stomach cancer.  ROS:   Please see the history of present illness.    All other systems reviewed and are negative.  EKGs/Labs/Other Studies Reviewed:    EKG:   08/04/2021: Sinus bradycardia. Rate 54 bpm. Nonspecific ST changes. 02/01/2021: sinus rhythm.  Rate 61 bpm.  Nonspecific ST abnormalities.  Recent Labs: 09/17/2020: Hemoglobin 13.5; Platelets 256 10/19/2020: TSH 1.140 04/01/2021: ALT 15; BUN 11; Creatinine, Ser 0.63; Potassium 4.1; Sodium 138   Recent Lipid Panel    Component Value Date/Time   CHOL 283 (H) 09/17/2020 1527   TRIG 416 (H) 09/17/2020 1527   HDL 65 09/17/2020 1527   CHOLHDL 4.4 09/17/2020 1527   LDLCALC 142 (H) 09/17/2020 1527    Physical Exam:   VS:  BP 134/78 (BP Location: Left Arm, Patient Position: Sitting, Cuff Size: Normal)   Pulse (!) 54   Ht 5' (1.524 m)   Wt 139 lb 6.4 oz (63.2 kg)   LMP 03/29/2021   BMI 27.22 kg/m  , BMI Body mass index is  27.22 kg/m. GENERAL:  Well appearing HEENT: Pupils equal round and reactive, fundi not visualized, oral mucosa unremarkable NECK:  No jugular venous distention, waveform within normal limits, carotid upstroke brisk and symmetric, no bruits, no thyromegaly LUNGS:  Clear to auscultation bilaterally HEART:  RRR.  PMI not displaced or sustained,S1 and S2 within normal limits, no S3, no S4, no clicks, no rubs, no murmurs ABD:  Flat, positive bowel sounds normal in frequency in pitch, no bruits, no rebound, no guarding, no midline pulsatile mass, no hepatomegaly, no splenomegaly EXT:  2 plus pulses throughout, no edema, no cyanosis no clubbing SKIN:  No rashes no nodules NEURO:  Cranial nerves II through XII grossly intact, motor grossly intact throughout PSYCH:  Cognitively intact, oriented to person place and time   ASSESSMENT:    1. Essential hypertension   2. Bradycardia      PLAN:   Essential hypertension Blood pressure has been better controlled.  She is having rare spikes have come down without needing hydralazine.  She still likes to have the hydralazine on hand just in case.  Continue losartan.  She is going to work on increasing her exercise to at least 150 minutes weekly.  She will also continue working on her diet.  She recently enrolled in the weight watchers program.  She has a family history of carcinoid syndrome but testing was negative for her.  Bradycardia She is asymptomatic.  Continue to monitor and avoid nodal agents.   Disposition: FU with Toi Stelly C. Oval Linsey, MD, [facility]  PRN.   Medication Adjustments/Labs and Tests Ordered: Current medicines are reviewed at length with the patient today.  Concerns regarding medicines are outlined above.   Orders Placed This Encounter  Procedures   EKG 12-Lead    No orders of the defined types were placed in this encounter.  I,Mathew Stumpf,acting as a Education administrator for Skeet Latch, MD.,have documented all relevant documentation  on the behalf of Skeet Latch, MD,as directed by  Skeet Latch, MD while in the presence of Skeet Latch, MD.  I, Dragoon Oval Linsey, MD have reviewed all documentation for this visit.  The documentation of the exam, diagnosis, procedures, and orders on 08/04/2021 are all accurate and complete.   Signed, Skeet Latch, MD  08/04/2021 1:05 PM    [facility]

## 2021-08-04 ENCOUNTER — Other Ambulatory Visit: Payer: Self-pay

## 2021-08-04 ENCOUNTER — Ambulatory Visit (INDEPENDENT_AMBULATORY_CARE_PROVIDER_SITE_OTHER): Payer: 59 | Admitting: Cardiovascular Disease

## 2021-08-04 ENCOUNTER — Encounter (HOSPITAL_BASED_OUTPATIENT_CLINIC_OR_DEPARTMENT_OTHER): Payer: Self-pay | Admitting: Cardiovascular Disease

## 2021-08-04 DIAGNOSIS — I10 Essential (primary) hypertension: Secondary | ICD-10-CM | POA: Diagnosis not present

## 2021-08-04 DIAGNOSIS — R001 Bradycardia, unspecified: Secondary | ICD-10-CM

## 2021-08-04 HISTORY — DX: Bradycardia, unspecified: R00.1

## 2021-08-04 NOTE — Assessment & Plan Note (Addendum)
Blood pressure has been better controlled.  She is having rare spikes have come down without needing hydralazine.  She still likes to have the hydralazine on hand just in case.  Continue losartan.  She is going to work on increasing her exercise to at least 150 minutes weekly.  She will also continue working on her diet.  She recently enrolled in the weight watchers program.  She has a family history of carcinoid syndrome but testing was negative for her.

## 2021-08-04 NOTE — Patient Instructions (Signed)
Medication Instructions:  Continue current medications  *If you need a refill on your cardiac medications before your next appointment, please call your pharmacy*   Lab Work: None Ordered   Testing/Procedures: None Ordered   Follow-Up: At [facility], you and your health needs are our priority.  As part of our continuing mission to provide you with exceptional heart care, we have created designated Provider Care Teams.  These Care Teams include your primary Cardiologist (physician) and Advanced Practice Providers (APPs -  Physician Assistants and Nurse Practitioners) who all work together to provide you with the care you need, when you need it.  We recommend signing up for the patient portal called "MyChart".  Sign up information is provided on this After Visit Summary.  MyChart is used to connect with patients for Virtual Visits (Telemedicine).  Patients are able to view lab/test results, encounter notes, upcoming appointments, etc.  Non-urgent messages can be sent to your provider as well.   To learn more about what you can do with MyChart, go to [email or web address].    Your next appointment:   As Needed

## 2021-08-04 NOTE — Assessment & Plan Note (Signed)
She is asymptomatic.  Continue to monitor and avoid nodal agents.

## 2021-09-22 ENCOUNTER — Ambulatory Visit: Payer: 59 | Admitting: Neurology

## 2021-10-06 ENCOUNTER — Ambulatory Visit: Payer: 59 | Admitting: Family Medicine

## 2021-11-11 ENCOUNTER — Telehealth: Payer: Self-pay | Admitting: Neurology

## 2021-11-11 DIAGNOSIS — Z789 Other specified health status: Secondary | ICD-10-CM

## 2021-11-11 DIAGNOSIS — G4733 Obstructive sleep apnea (adult) (pediatric): Secondary | ICD-10-CM

## 2021-11-11 NOTE — Telephone Encounter (Signed)
I called and spoke to pt and let her know that referral was sent,  and I sent her information on the mychart relating to hours, address and phone for Dr. Augustina Mood.  She appreciated call back.

## 2021-11-11 NOTE — Telephone Encounter (Signed)
I placed a referral to Dr. Augustina Mood.

## 2021-11-11 NOTE — Telephone Encounter (Signed)
Referral has been sent.

## 2021-11-11 NOTE — Telephone Encounter (Signed)
I was able to pull up the [employer] website but there was no data, likely because patient would be a card download.  I called the patient and she said that she only put the mask on a couple of times but could not actually use the AutoPap.  She was set up in August 2022.  She has prior intolerance of AutoPap and Dr. Rexene Alberts had given her the option of being referred to dentistry for possible oral appliance.  The patient would like to proceed with this if okay.  However she understands we have not seen her since February 2022 and an appointment may be needed.  I let her know I would discuss with Dr. Rexene Alberts and give her a call back as soon as possible.  She is able to still come on Monday at 945 if the appointment is indeed needed versus sending a referral directly to a dentist.

## 2021-11-11 NOTE — Addendum Note (Signed)
Addended by: Star Age on: 11/11/2021 12:27 PM   Modules accepted: Orders

## 2021-11-11 NOTE — Telephone Encounter (Signed)
Pt cancelled appt due to having anxieties about using the CPAP machine. Would like a call from the nurse to discuss what to do next or about compliance.

## 2021-11-11 NOTE — Telephone Encounter (Signed)
I am unable to pull up the [employer] website to check her compliance. Will try again later.

## 2021-11-15 ENCOUNTER — Ambulatory Visit: Payer: 59 | Admitting: Neurology

## 2021-11-22 ENCOUNTER — Ambulatory Visit: Payer: 59 | Admitting: Family Medicine

## 2021-11-24 ENCOUNTER — Ambulatory Visit (INDEPENDENT_AMBULATORY_CARE_PROVIDER_SITE_OTHER): Payer: 59 | Admitting: Family Medicine

## 2021-11-24 VITALS — BP 128/78 | HR 59 | Temp 98.1°F | Resp 16 | Ht 60.0 in | Wt 138.2 lb

## 2021-11-24 DIAGNOSIS — I10 Essential (primary) hypertension: Secondary | ICD-10-CM

## 2021-11-24 DIAGNOSIS — G4733 Obstructive sleep apnea (adult) (pediatric): Secondary | ICD-10-CM

## 2021-11-24 DIAGNOSIS — Z23 Encounter for immunization: Secondary | ICD-10-CM

## 2021-11-24 MED ORDER — LOSARTAN POTASSIUM 50 MG PO TABS: 50.0000 mg | ORAL_TABLET | Freq: Every day | ORAL | 1 refills | Status: DC

## 2021-11-24 NOTE — Patient Instructions (Addendum)
No change in meds for now.  Keep follow up with dentist to discuss device for sleep apnea, but if you would want to try autopap again we could consider a short term (1-2 weeks only) anxiety medication to help transition to using mask if that would be helpful. Let me know.  Take care.

## 2021-11-24 NOTE — Progress Notes (Signed)
Subjective:  Patient ID: Sarah Beasley, female    DOB: Feb 25, 1961  Age: 61 y.o. MRN: [id number]  CC:  Chief Complaint  Patient presents with   Hypertension    Pt here for 6 month recheck , no concerns per pt doing well     HPI Sarah Beasley presents for   Hypertension: Complicated by history of OSA.  Treated by Dr. Oval Linsey, hypertension clinic.  Family history of neuroendocrine tumors.  Urine 5-HIAA was within normal limits.  Option of hydralazine as needed for spikes in BP, with daily use of losartan.  Some spikes in blood pressure.  At the September hypertension clinic visit but had not taken additional medication.  Plan for continued losartan with option of hydralazine, increased exercise with goal of 150 minutes weekly.  Also plan for diet changes and was enrolled in weight watchers program at that time. Notes reviewed from sleep specialist.  Unable to tolerate the AutoPap - claustrophobic. Has taken Unisom to help sleep. Moderate OSA with AHI of 22, O2 nadir 80%. plan for evaluation by dentist for oral appliance.  Referred to Dr. Toy Cookey. - plans to  No recent spikes in BP recently, no need for hydralazine.  Home readings:120-125/86.  No new side effects with meds.  Exercise - on and off - some walking last week - 38min x 3. Helpful for sleep.   BP Readings from Last 3 Encounters:  11/24/21 128/78  08/04/21 134/78  04/29/21 137/80   Lab Results  Component Value Date   CREATININE 0.63 04/01/2021    Immunization History  Administered Date(s) Administered   Hepatitis A, Adult 01/04/2018   Influenza Inj Mdck Quad Pf 08/27/2019   Influenza,inj,Quad PF,6+ Mos 09/24/2015, 01/04/2018, 12/05/2018   Influenza-Unspecified 09/02/2020, 08/23/2021   PFIZER Comirnaty(Gray Top)Covid-19 Tri-Sucrose Vaccine 01/27/2020, 02/24/2020, 09/02/2020, 08/23/2021   Rabies, IM 09/10/2018, 09/13/2018, 09/17/2018, 09/24/2018   Tdap 04/14/2016   Typhoid Live 01/04/2018   Zoster  Recombinat (Shingrix) 09/17/2020  2nd shingrix  recommended - plans on nurse visit on a Friday.  History Patient Active Problem List   Diagnosis Date Noted   Bradycardia 08/04/2021   Essential hypertension 02/01/2021   OSA (obstructive sleep apnea) 02/01/2021   Hematuria 07/07/2017   Past Medical History:  Diagnosis Date   Bradycardia 08/04/2021   Essential hypertension 02/01/2021   Heart murmur    past history   Hypertension    OSA (obstructive sleep apnea) 02/01/2021   Sleep apnea    does not wear cpap as of yet. backordered   Past Surgical History:  Procedure Laterality Date   KNEE SURGERY Left    MOUTH SURGERY     No Known Allergies Prior to Admission medications   Medication Sig Start Date End Date Taking? Authorizing Provider  hydrALAZINE (APRESOLINE) 25 MG tablet TAKE 1 TABLET BY MOUTH AS NEEDED FOR BLOOD PRESSURE ABOVE 160 04/29/21  Yes Skeet Latch, MD  Ibuprofen (ADVIL PO) Take by mouth.   Yes [provider]  losartan (COZAAR) 50 MG tablet TAKE 1 TABLET BY MOUTH EVERY DAY 06/02/21  Yes [name], MD  Multiple Vitamin (MULTIVITAMIN) tablet Take 1 tablet by mouth daily.   Yes [provider]   Social History   Socioeconomic History   Marital status: Single    Spouse name: Not on file   Number of children: 0   Years of education: BA   Highest education level: Not on file  Occupational History   Not on file  Tobacco Use  Smoking status: Former    Types: Cigarettes   Smokeless tobacco: Never  Vaping Use   Vaping Use: Never used  Substance and Sexual Activity   Alcohol use: Yes    Alcohol/week: 10.0 standard drinks    Types: 10 Glasses of wine per week    Comment: 10 glasses through out the week   Drug use: No   Sexual activity: Not Currently  Other Topics Concern   Not on file  Social History Narrative   Exercise walking for 1 hour   Drinks 1 cup of coffee in the morning.    Social Determinants of Health   Financial  Resource Strain: Low Risk    Difficulty of Paying Living Expenses: Not hard at all  Food Insecurity: No Food Insecurity   Worried About Charity fundraiser in the Last Year: Never true   [place] in the Last Year: Never true  Transportation Needs: No Transportation Needs   Lack of Transportation (Medical): No   Lack of Transportation (Non-Medical): No  Physical Activity: Inactive   Days of Exercise per Week: 0 days   Minutes of Exercise per Session: 0 min  Stress: No Stress Concern Present   Feeling of Stress : Only a little  Social Connections: Not on file  Intimate Partner Violence: Not on file    Review of Systems  Constitutional:  Negative for fatigue and unexpected weight change.  Respiratory:  Negative for chest tightness and shortness of breath.   Cardiovascular:  Negative for chest pain, palpitations and leg swelling.  Gastrointestinal:  Negative for abdominal pain and blood in stool.  Neurological:  Negative for dizziness, syncope, light-headedness and headaches.    Objective:   Vitals:   11/24/21 1433  BP: 128/78  Pulse: (!) 59  Resp: 16  Temp: 98.1 F (36.7 C)  TempSrc: Temporal  SpO2: 99%  Weight: 138 lb 3.2 oz (62.7 kg)  Height: 5' (1.524 m)     Physical Exam Vitals reviewed.  Constitutional:      Appearance: Normal appearance. She is well-developed.  HENT:     Head: Normocephalic and atraumatic.  Eyes:     Conjunctiva/sclera: Conjunctivae normal.     Pupils: Pupils are equal, round, and reactive to light.  Neck:     Vascular: No carotid bruit.  Cardiovascular:     Rate and Rhythm: Normal rate and regular rhythm.     Heart sounds: Normal heart sounds.  Pulmonary:     Effort: Pulmonary effort is normal.     Breath sounds: Normal breath sounds.  Abdominal:     Palpations: Abdomen is soft. There is no pulsatile mass.     Tenderness: There is no abdominal tenderness.  Musculoskeletal:     Right lower leg: No edema.     Left lower leg: No  edema.  Skin:    General: Skin is warm and dry.  Neurological:     Mental Status: She is alert and oriented to person, place, and time.  Psychiatric:        Mood and Affect: Mood normal.        Behavior: Behavior normal.       Assessment & Plan:  Sarah Beasley is a 61 y.o. female . OSA (obstructive sleep apnea)  -Follow-up planned with dentist to consider dental device.  I would also consider short-term benzodiazepine for adjustment to mask if needed.  Short-term nature discussed and potential risks discussed.  She will let me know.  Essential hypertension - Plan: losartan (COZAAR) 50 MG tablet, Basic metabolic panel  -Stable, continue losartan, has hydralazine if needed for spikes in blood pressure but has not occurred recently.  Commended on exercise.  Need for shingles vaccine - Plan: Varicella-zoster vaccine IM Nurse visit planned on Friday.  Second dose.  Meds ordered this encounter  Medications   losartan (COZAAR) 50 MG tablet    Sig: Take 1 tablet (50 mg total) by mouth daily.    Dispense:  90 tablet    Refill:  1   Patient Instructions  No change in meds for now.  Keep follow up with dentist to discuss device for sleep apnea, but if you would want to try autopap again we could consider a short term (1-2 weeks only) anxiety medication to help transition to using mask if that would be helpful. Let me know.  Take care.     Signed,   Merri Ray, MD [place] 11/24/21 4:54 PM

## 2021-11-25 LAB — BASIC METABOLIC PANEL
BUN: 11 mg/dL (ref 6–23)
CO2: 26 mEq/L (ref 19–32)
Calcium: 10.2 mg/dL (ref 8.4–10.5)
Chloride: 101 mEq/L (ref 96–112)
Creatinine, Ser: 0.67 mg/dL (ref 0.40–1.20)
GFR: 95.17 mL/min (ref >60.00)
Glucose, Bld: 82 mg/dL (ref 70–99)
Potassium: 4.2 mEq/L (ref 3.5–5.1)
Sodium: 140 mEq/L (ref 135–145)

## 2021-12-24 ENCOUNTER — Ambulatory Visit (INDEPENDENT_AMBULATORY_CARE_PROVIDER_SITE_OTHER): Payer: 59 | Admitting: Family Medicine

## 2021-12-24 DIAGNOSIS — Z23 Encounter for immunization: Secondary | ICD-10-CM

## 2021-12-24 NOTE — Progress Notes (Signed)
Pt has received injection without issue. Was warned of potential side effects. Pt did well after first injection

## 2022-05-31 ENCOUNTER — Other Ambulatory Visit: Payer: Self-pay | Admitting: Cardiovascular Disease

## 2022-05-31 NOTE — Telephone Encounter (Signed)
Rx(s) sent to pharmacy electronically.  

## 2022-06-06 ENCOUNTER — Encounter: Payer: Self-pay | Admitting: Family Medicine

## 2022-06-06 ENCOUNTER — Ambulatory Visit (INDEPENDENT_AMBULATORY_CARE_PROVIDER_SITE_OTHER): Payer: 59 | Admitting: Family Medicine

## 2022-06-06 VITALS — BP 128/60 | HR 73 | Temp 98.0°F | Resp 16 | Ht 59.5 in | Wt 137.0 lb

## 2022-06-06 DIAGNOSIS — Z0000 Encounter for general adult medical examination without abnormal findings: Secondary | ICD-10-CM

## 2022-06-06 DIAGNOSIS — Z131 Encounter for screening for diabetes mellitus: Secondary | ICD-10-CM

## 2022-06-06 DIAGNOSIS — Z13220 Encounter for screening for lipoid disorders: Secondary | ICD-10-CM | POA: Diagnosis not present

## 2022-06-06 DIAGNOSIS — Z808 Family history of malignant neoplasm of other organs or systems: Secondary | ICD-10-CM

## 2022-06-06 DIAGNOSIS — I10 Essential (primary) hypertension: Secondary | ICD-10-CM

## 2022-06-06 DIAGNOSIS — G4733 Obstructive sleep apnea (adult) (pediatric): Secondary | ICD-10-CM

## 2022-06-06 DIAGNOSIS — Z1283 Encounter for screening for malignant neoplasm of skin: Secondary | ICD-10-CM

## 2022-06-06 LAB — HEMOGLOBIN A1C: Hgb A1c MFr Bld: 5.2 % (ref 4.6–6.5)

## 2022-06-06 MED ORDER — LOSARTAN POTASSIUM 50 MG PO TABS: 50.0000 mg | ORAL_TABLET | Freq: Every day | ORAL | 3 refills | Status: DC

## 2022-06-06 NOTE — Progress Notes (Signed)
Subjective:  Patient ID: Sarah Beasley, female    DOB: 11/02/1961  Age: 61 y.o. MRN: [id number]  CC:  Chief Complaint  Patient presents with   Annual Exam    Pt is fasting     HPI Sarah Beasley presents for Annual Exam  Care team: PCP, me Sleep Dr. Rexene Alberts, CPAP, plan for dental device -Dr. Toy Cookey, intolerant to CPAP - appt  this week with Dr. Toy Cookey.  Cardiology, Dr. Oval Linsey  Gyn: [facility].   Hypertension: Seen previously by cardiology, hypertension clinic.  Family history of carcinoid syndrome but testing was negative for her.  History of rare spikes in blood pressure.  Treated with losartan 50 mg with option of hydralazine 25 mg if elevated readings. No recent spikes in BP.  Home readings: 120/80's.  BP Readings from Last 3 Encounters:  06/06/22 128/60  11/24/21 128/78  08/04/21 134/78   Lab Results  Component Value Date   CREATININE 0.67 11/24/2021        06/06/2022    8:04 AM 11/24/2021    2:35 PM 04/01/2021   12:19 PM 11/20/2020    2:53 PM 10/19/2020   10:50 AM  Depression screen PHQ 2/9  Decreased Interest 0 0 0 0 0  Down, Depressed, Hopeless 0 0 0 0 0  PHQ - 2 Score 0 0 0 0 0  Altered sleeping _0 Tired, decreased energy _1 Change in appetite 1 0 1    Feeling bad or failure about yourself  0 0 0    Trouble concentrating 0 0 0    Moving slowly or fidgety/restless 0 0 0    Suicidal thoughts 0 0 0    PHQ-9 Score _2 Health Maintenance  Topic Date Due   COVID-19 Vaccine (5 - Booster for Rafael Hernandez series) 06/22/2022 (Originally 10/18/2021)   INFLUENZA VACCINE  06/07/2022   MAMMOGRAM  10/08/2022   PAP SMEAR-Modifier  10/09/2023   TETANUS/TDAP  04/14/2026   COLONOSCOPY (Pts 45-26yr Insurance coverage will need to be confirmed)  04/10/2031   Hepatitis C Screening  Completed   HIV Screening  Completed   Zoster Vaccines- Shingrix  Completed   HPV VACCINES  Aged Out  Colonoscopy: 04/09/21, 2 hyperplastic polyps, repeat  10 years.  Mammogram 10/2020. Diagnostic U/S and R mm in 04/2021.  Gynecologist 2 years ago - pap 10/2020.  No personal hx of skin CA, mother had skin CA - melanoma, other skin CA, requests derm referral.    Immunization History  Administered Date(s) Administered   Hepatitis A, Adult 01/04/2018   Influenza Inj Mdck Quad Pf 08/27/2019   Influenza,inj,Quad PF,6+ Mos 09/24/2015, 01/04/2018, 12/05/2018   Influenza-Unspecified 09/02/2020, 08/23/2021   PFIZER Comirnaty(Gray Top)Covid-19 Tri-Sucrose Vaccine 01/27/2020, 02/24/2020, 09/02/2020, 08/23/2021   Rabies, IM 09/10/2018, 09/13/2018, 09/17/2018, 09/24/2018   Tdap 04/14/2016   Typhoid Live 01/04/2018   Zoster Recombinat (Shingrix) 09/17/2020, 12/24/2021  Up to date on vaccines as above.   No results found. Overdue for optho - over 1 year ago - plans to schedule.   Dental: recent appt - every 6 months.  Alcohol: 10 glasses wine per week.   Tobacco: none.   Exercise: minimal with heat, usually walking - 3-4 days, 432m-hr. Active with 15k steps.     History Patient Active Problem List   Diagnosis Date Noted   Bradycardia 08/04/2021   Essential hypertension 02/01/2021   OSA (obstructive sleep  apnea) 02/01/2021   Hematuria 07/07/2017   Past Medical History:  Diagnosis Date   Bradycardia 08/04/2021   Essential hypertension 02/01/2021   Heart murmur    past history   Hypertension    OSA (obstructive sleep apnea) 02/01/2021   Sleep apnea    does not wear cpap as of yet. backordered   Past Surgical History:  Procedure Laterality Date   KNEE SURGERY Left    MOUTH SURGERY     No Known Allergies Prior to Admission medications   Medication Sig Start Date End Date Taking? Authorizing Provider  hydrALAZINE (APRESOLINE) 25 MG tablet TAKE 1 TABLET BY MOUTH AS NEEDED FOR BLOOD PRESSURE ABOVE 160 05/31/22  Yes Skeet Latch, MD  Ibuprofen (ADVIL PO) Take by mouth.   Yes [provider]  losartan (COZAAR) 50 MG tablet  Take 1 tablet (50 mg total) by mouth daily. 11/24/21  Yes [name], MD  Multiple Vitamin (MULTIVITAMIN) tablet Take 1 tablet by mouth daily.   Yes [provider]   Social History   Socioeconomic History   Marital status: Single    Spouse name: Not on file   Number of children: 0   Years of education: BA   Highest education level: Not on file  Occupational History   Not on file  Tobacco Use   Smoking status: Never   Smokeless tobacco: Never   Tobacco comments:    Passive smoke exposure on the job   Vaping Use   Vaping Use: Never used  Substance and Sexual Activity   Alcohol use: Yes    Alcohol/week: 10.0 standard drinks of alcohol    Types: 10 Glasses of wine per week    Comment: 10 glasses through out the week / 2 bottles wine   Drug use: No   Sexual activity: Not Currently  Other Topics Concern   Not on file  Social History Narrative   Exercise walking for 1 hour   Drinks 1 cup of coffee in the morning.    Social Determinants of Health   Financial Resource Strain: Low Risk  (08/04/2021)   Overall Financial Resource Strain (CARDIA)    Difficulty of Paying Living Expenses: Not hard at all  Food Insecurity: No Food Insecurity (08/04/2021)   Hunger Vital Sign    Worried About Running Out of Food in the Last Year: Never true    Ran Out of Food in the Last Year: Never true  Transportation Needs: No Transportation Needs (08/04/2021)   PRAPARE - Hydrologist (Medical): No    Lack of Transportation (Non-Medical): No  Physical Activity: Inactive (08/04/2021)   Exercise Vital Sign    Days of Exercise per Week: 0 days    Minutes of Exercise per Session: 0 min  Stress: No Stress Concern Present (02/01/2021)   [place]    Feeling of Stress : Only a little  Social Connections: Not on file  Intimate Partner Violence: Not on file    Review of Systems  13 point review  of systems per patient health survey noted.  Negative other than as indicated above or in HPI.   Objective:   Vitals:   06/06/22 0804  BP: 128/60  Pulse: 73  Resp: 16  Temp: 98 F (36.7 C)  TempSrc: Oral  SpO2: 97%  Weight: 137 lb (62.1 kg)  Height: 4' 11.5" (1.511 m)     Physical Exam Constitutional:  Appearance: She is well-developed.  HENT:     Head: Normocephalic and atraumatic.     Right Ear: External ear normal.     Left Ear: External ear normal.  Eyes:     Conjunctiva/sclera: Conjunctivae normal.     Pupils: Pupils are equal, round, and reactive to light.  Neck:     Thyroid: No thyromegaly.  Cardiovascular:     Rate and Rhythm: Normal rate and regular rhythm.     Heart sounds: Normal heart sounds. No murmur heard. Pulmonary:     Effort: Pulmonary effort is normal. No respiratory distress.     Breath sounds: Normal breath sounds. No wheezing.  Abdominal:     General: Bowel sounds are normal.     Palpations: Abdomen is soft.     Tenderness: There is no abdominal tenderness.  Musculoskeletal:        General: No tenderness. Normal range of motion.     Cervical back: Normal range of motion and neck supple.  Lymphadenopathy:     Cervical: No cervical adenopathy.  Skin:    General: Skin is warm and dry.     Findings: No rash.  Neurological:     Mental Status: She is alert and oriented to person, place, and time.  Psychiatric:        Behavior: Behavior normal.        Thought Content: Thought content normal.        Assessment & Plan:  Jennica Tagliaferri is a 60 y.o. female . Annual physical exam - Plan: Comprehensive metabolic panel, Lipid panel, Hemoglobin A1c  --anticipatory guidance as below in AVS, screening labs above. Health maintenance items as above in HPI discussed/recommended as applicable.   Essential hypertension - Plan: losartan (COZAAR) 50 MG tablet  -  Stable, tolerating current regimen. Medications refilled. Labs pending as above.    Screening for hyperlipidemia - Plan: Lipid panel  Screening for diabetes mellitus - Plan: Hemoglobin A1c  OSA (obstructive sleep apnea)  - follow up as planned for possible dental device.   Family history of skin cancer - Plan: Ambulatory referral to Dermatology Screening for skin cancer - Plan: Ambulatory referral to Dermatology   Meds ordered this encounter  Medications   losartan (COZAAR) 50 MG tablet    Sig: Take 1 tablet (50 mg total) by mouth daily.    Dispense:  90 tablet    Refill:  3   Patient Instructions  No med changes for now.  Schedule visit with ophthalmology and schedule mammogram when possible. If any concerns on labs I will let you know.  I will refer you to dermatology as we discussed.  Thanks for coming in today and take care.  Preventive Care 18-39 Years Old, Female Preventive care refers to lifestyle choices and visits with your health care provider that can promote health and wellness. Preventive care visits are also called wellness exams. What can I expect for my preventive care visit? Counseling Your health care provider may ask you questions about your: Medical history, including: Past medical problems. Family medical history. Pregnancy history. Current health, including: Menstrual cycle. Method of birth control. Emotional well-being. Home life and relationship well-being. Sexual activity and sexual health. Lifestyle, including: Alcohol, nicotine or tobacco, and drug use. Access to firearms. Diet, exercise, and sleep habits. Work and work Statistician. Sunscreen use. Safety issues such as seatbelt and bike helmet use. Physical exam Your health care provider will check your: Height and weight. These may be used to calculate  your BMI (body mass index). BMI is a measurement that tells if you are at a healthy weight. Waist circumference. This measures the distance around your waistline. This measurement also tells if you are at a healthy weight  and may help predict your risk of certain diseases, such as type 2 diabetes and high blood pressure. Heart rate and blood pressure. Body temperature. Skin for abnormal spots. What immunizations do I need?  Vaccines are usually given at various ages, according to a schedule. Your health care provider will recommend vaccines for you based on your age, medical history, and lifestyle or other factors, such as travel or where you work. What tests do I need? Screening Your health care provider may recommend screening tests for certain conditions. This may include: Lipid and cholesterol levels. Diabetes screening. This is done by checking your blood sugar (glucose) after you have not eaten for a while (fasting). Pelvic exam and Pap test. Hepatitis B test. Hepatitis C test. HIV (human immunodeficiency virus) test. STI (sexually transmitted infection) testing, if you are at risk. Lung cancer screening. Colorectal cancer screening. Mammogram. Talk with your health care provider about when you should start having regular mammograms. This may depend on whether you have a family history of breast cancer. BRCA-related cancer screening. This may be done if you have a family history of breast, ovarian, tubal, or peritoneal cancers. Bone density scan. This is done to screen for osteoporosis. Talk with your health care provider about your test results, treatment options, and if necessary, the need for more tests. Follow these instructions at home: Eating and drinking  Eat a diet that includes fresh fruits and vegetables, whole grains, lean protein, and low-fat dairy products. Take vitamin and mineral supplements as recommended by your health care provider. Do not drink alcohol if: Your health care provider tells you not to drink. You are pregnant, may be pregnant, or are planning to become pregnant. If you drink alcohol: Limit how much you have to 0-1 drink a day. Know how much alcohol is in your  drink. In the [place] one drink equals one 12 oz bottle of beer (355 mL), one 5 oz glass of wine (148 mL), or one 1 oz glass of hard liquor (44 mL). Lifestyle Brush your teeth every morning and night with fluoride toothpaste. Floss one time each day. Exercise for at least 30 minutes 5 or more days each week. Do not use any products that contain nicotine or tobacco. These products include cigarettes, chewing tobacco, and vaping devices, such as e-cigarettes. If you need help quitting, ask your health care provider. Do not use drugs. If you are sexually active, practice safe sex. Use a condom or other form of protection to prevent STIs. If you do not wish to become pregnant, use a form of birth control. If you plan to become pregnant, see your health care provider for a prepregnancy visit. Take aspirin only as told by your health care provider. Make sure that you understand how much to take and what form to take. Work with your health care provider to find out whether it is safe and beneficial for you to take aspirin daily. Find healthy ways to manage stress, such as: Meditation, yoga, or listening to music. Journaling. Talking to a trusted person. Spending time with friends and family. Minimize exposure to UV radiation to reduce your risk of skin cancer. Safety Always wear your seat belt while driving or riding in a vehicle. Do not drive: If you have been  drinking alcohol. Do not ride with someone who has been drinking. When you are tired or distracted. While texting. If you have been using any mind-altering substances or drugs. Wear a helmet and other protective equipment during sports activities. If you have firearms in your house, make sure you follow all gun safety procedures. Seek help if you have been physically or sexually abused. What's next? Visit your health care provider once a year for an annual wellness visit. Ask your health care provider how often you should have your eyes and  teeth checked. Stay up to date on all vaccines. This information is not intended to replace advice given to you by your health care provider. Make sure you discuss any questions you have with your health care provider. Document Revised: 04/21/2021 Document Reviewed: 04/21/2021 Elsevier Patient Education  [place],   Merri Ray, MD [place] 06/06/22 8:30 AM

## 2022-06-06 NOTE — Patient Instructions (Addendum)
No med changes for now.  Schedule visit with ophthalmology and schedule mammogram when possible. If any concerns on labs I will let you know.  I will refer you to dermatology as we discussed.  Thanks for coming in today and take care.  Preventive Care 61-61 Years Old, Female Preventive care refers to lifestyle choices and visits with your health care provider that can promote health and wellness. Preventive care visits are also called wellness exams. What can I expect for my preventive care visit? Counseling Your health care provider may ask you questions about your: Medical history, including: Past medical problems. Family medical history. Pregnancy history. Current health, including: Menstrual cycle. Method of birth control. Emotional well-being. Home life and relationship well-being. Sexual activity and sexual health. Lifestyle, including: Alcohol, nicotine or tobacco, and drug use. Access to firearms. Diet, exercise, and sleep habits. Work and work Statistician. Sunscreen use. Safety issues such as seatbelt and bike helmet use. Physical exam Your health care provider will check your: Height and weight. These may be used to calculate your BMI (body mass index). BMI is a measurement that tells if you are at a healthy weight. Waist circumference. This measures the distance around your waistline. This measurement also tells if you are at a healthy weight and may help predict your risk of certain diseases, such as type 2 diabetes and high blood pressure. Heart rate and blood pressure. Body temperature. Skin for abnormal spots. What immunizations do I need?  Vaccines are usually given at various ages, according to a schedule. Your health care provider will recommend vaccines for you based on your age, medical history, and lifestyle or other factors, such as travel or where you work. What tests do I need? Screening Your health care provider may recommend screening tests for certain  conditions. This may include: Lipid and cholesterol levels. Diabetes screening. This is done by checking your blood sugar (glucose) after you have not eaten for a while (fasting). Pelvic exam and Pap test. Hepatitis B test. Hepatitis C test. HIV (human immunodeficiency virus) test. STI (sexually transmitted infection) testing, if you are at risk. Lung cancer screening. Colorectal cancer screening. Mammogram. Talk with your health care provider about when you should start having regular mammograms. This may depend on whether you have a family history of breast cancer. BRCA-related cancer screening. This may be done if you have a family history of breast, ovarian, tubal, or peritoneal cancers. Bone density scan. This is done to screen for osteoporosis. Talk with your health care provider about your test results, treatment options, and if necessary, the need for more tests. Follow these instructions at home: Eating and drinking  Eat a diet that includes fresh fruits and vegetables, whole grains, lean protein, and low-fat dairy products. Take vitamin and mineral supplements as recommended by your health care provider. Do not drink alcohol if: Your health care provider tells you not to drink. You are pregnant, may be pregnant, or are planning to become pregnant. If you drink alcohol: Limit how much you have to 0-1 drink a day. Know how much alcohol is in your drink. In the [place] one drink equals one 12 oz bottle of beer (355 mL), one 5 oz glass of wine (148 mL), or one 1 oz glass of hard liquor (44 mL). Lifestyle Brush your teeth every morning and night with fluoride toothpaste. Floss one time each day. Exercise for at least 30 minutes 5 or more days each week. Do not use any products that contain nicotine  or tobacco. These products include cigarettes, chewing tobacco, and vaping devices, such as e-cigarettes. If you need help quitting, ask your health care provider. Do not use drugs. If  you are sexually active, practice safe sex. Use a condom or other form of protection to prevent STIs. If you do not wish to become pregnant, use a form of birth control. If you plan to become pregnant, see your health care provider for a prepregnancy visit. Take aspirin only as told by your health care provider. Make sure that you understand how much to take and what form to take. Work with your health care provider to find out whether it is safe and beneficial for you to take aspirin daily. Find healthy ways to manage stress, such as: Meditation, yoga, or listening to music. Journaling. Talking to a trusted person. Spending time with friends and family. Minimize exposure to UV radiation to reduce your risk of skin cancer. Safety Always wear your seat belt while driving or riding in a vehicle. Do not drive: If you have been drinking alcohol. Do not ride with someone who has been drinking. When you are tired or distracted. While texting. If you have been using any mind-altering substances or drugs. Wear a helmet and other protective equipment during sports activities. If you have firearms in your house, make sure you follow all gun safety procedures. Seek help if you have been physically or sexually abused. What's next? Visit your health care provider once a year for an annual wellness visit. Ask your health care provider how often you should have your eyes and teeth checked. Stay up to date on all vaccines. This information is not intended to replace advice given to you by your health care provider. Make sure you discuss any questions you have with your health care provider. Document Revised: 04/21/2021 Document Reviewed: 04/21/2021 Elsevier Patient Education  [place].

## 2022-06-07 LAB — COMPREHENSIVE METABOLIC PANEL
ALT: 17 U/L (ref 0–35)
AST: 21 U/L (ref 0–37)
Albumin: 4.6 g/dL (ref 3.5–5.2)
Alkaline Phosphatase: 65 U/L (ref 39–117)
BUN: 10 mg/dL (ref 6–23)
CO2: 22 mEq/L (ref 19–32)
Calcium: 9.9 mg/dL (ref 8.4–10.5)
Chloride: 103 mEq/L (ref 96–112)
Creatinine, Ser: 0.69 mg/dL (ref 0.40–1.20)
GFR: 94.14 mL/min (ref >60.00)
Glucose, Bld: 84 mg/dL (ref 70–99)
Potassium: 4.5 mEq/L (ref 3.5–5.1)
Sodium: 138 mEq/L (ref 135–145)
Total Bilirubin: 0.6 mg/dL (ref 0.2–1.2)
Total Protein: 7.5 g/dL (ref 6.0–8.3)

## 2022-06-07 LAB — LIPID PANEL
Cholesterol: 258 mg/dL — ABNORMAL HIGH (ref 0–200)
HDL: 64.6 mg/dL (ref >39.00)
NonHDL: 193.46
Total CHOL/HDL Ratio: 4
Triglycerides: 294 mg/dL — ABNORMAL HIGH (ref 0.0–149.0)
VLDL: 58.8 mg/dL — ABNORMAL HIGH (ref 0.0–40.0)

## 2022-06-07 LAB — LDL CHOLESTEROL, DIRECT: Direct LDL: 145 mg/dL

## 2022-08-22 ENCOUNTER — Ambulatory Visit (INDEPENDENT_AMBULATORY_CARE_PROVIDER_SITE_OTHER): Payer: 59 | Admitting: Family Medicine

## 2022-08-22 DIAGNOSIS — Z23 Encounter for immunization: Secondary | ICD-10-CM | POA: Diagnosis not present

## 2022-08-22 NOTE — Progress Notes (Signed)
Patient presented today for influenza vaccine no allergies identified no concerns today

## 2022-09-03 ENCOUNTER — Other Ambulatory Visit: Payer: Self-pay | Admitting: Cardiovascular Disease

## 2022-09-05 NOTE — Telephone Encounter (Signed)
Rx(s) sent to pharmacy electronically.  

## 2022-12-05 ENCOUNTER — Other Ambulatory Visit: Payer: Self-pay | Admitting: Cardiovascular Disease

## 2023-01-23 ENCOUNTER — Other Ambulatory Visit: Payer: Self-pay | Admitting: Family Medicine

## 2023-01-23 DIAGNOSIS — Z1231 Encounter for screening mammogram for malignant neoplasm of breast: Secondary | ICD-10-CM

## 2023-03-08 ENCOUNTER — Ambulatory Visit
Admission: RE | Admit: 2023-03-08 | Discharge: 2023-03-08 | Disposition: A | Discharge status: Home / Self Care | Payer: 59 | Source: Ambulatory Visit | Attending: Family Medicine | Admitting: Family Medicine

## 2023-03-08 DIAGNOSIS — Z1231 Encounter for screening mammogram for malignant neoplasm of breast: Secondary | ICD-10-CM

## 2023-06-07 ENCOUNTER — Encounter (INDEPENDENT_AMBULATORY_CARE_PROVIDER_SITE_OTHER): Payer: Self-pay

## 2023-06-07 ENCOUNTER — Encounter: Payer: 59 | Admitting: Family Medicine

## 2023-06-16 ENCOUNTER — Encounter: Payer: 59 | Admitting: Family Medicine

## 2023-07-03 ENCOUNTER — Ambulatory Visit (INDEPENDENT_AMBULATORY_CARE_PROVIDER_SITE_OTHER): Payer: 59 | Admitting: Family Medicine

## 2023-07-03 ENCOUNTER — Encounter: Payer: Self-pay | Admitting: Family Medicine

## 2023-07-03 VITALS — BP 126/80 | HR 77 | Temp 97.6°F | Ht 60.0 in | Wt 135.0 lb

## 2023-07-03 DIAGNOSIS — R001 Bradycardia, unspecified: Secondary | ICD-10-CM

## 2023-07-03 DIAGNOSIS — I10 Essential (primary) hypertension: Secondary | ICD-10-CM | POA: Diagnosis not present

## 2023-07-03 DIAGNOSIS — Z0000 Encounter for general adult medical examination without abnormal findings: Secondary | ICD-10-CM | POA: Diagnosis not present

## 2023-07-03 DIAGNOSIS — R002 Palpitations: Secondary | ICD-10-CM | POA: Diagnosis not present

## 2023-07-03 DIAGNOSIS — E782 Mixed hyperlipidemia: Secondary | ICD-10-CM | POA: Diagnosis not present

## 2023-07-03 LAB — COMPREHENSIVE METABOLIC PANEL
ALT: 11 U/L (ref 0–35)
AST: 14 U/L (ref 0–37)
Albumin: 4.3 g/dL (ref 3.5–5.2)
Alkaline Phosphatase: 53 U/L (ref 39–117)
BUN: 11 mg/dL (ref 6–23)
CO2: 27 mEq/L (ref 19–32)
Calcium: 9.6 mg/dL (ref 8.4–10.5)
Chloride: 102 mEq/L (ref 96–112)
Creatinine, Ser: 0.64 mg/dL (ref 0.40–1.20)
GFR: 95.15 mL/min (ref >60.00)
Glucose, Bld: 90 mg/dL (ref 70–99)
Potassium: 4 mEq/L (ref 3.5–5.1)
Sodium: 138 mEq/L (ref 135–145)
Total Bilirubin: 0.7 mg/dL (ref 0.2–1.2)
Total Protein: 6.7 g/dL (ref 6.0–8.3)

## 2023-07-03 LAB — LIPID PANEL
Cholesterol: 279 mg/dL — ABNORMAL HIGH (ref 0–200)
HDL: 64.2 mg/dL (ref >39.00)
NonHDL: 214.8
Total CHOL/HDL Ratio: 4
Triglycerides: 273 mg/dL — ABNORMAL HIGH (ref 0.0–149.0)
VLDL: 54.6 mg/dL — ABNORMAL HIGH (ref 0.0–40.0)

## 2023-07-03 LAB — CBC
HCT: 40.7 % (ref 36.0–46.0)
Hemoglobin: 13.2 g/dL (ref 12.0–15.0)
MCHC: 32.5 g/dL (ref 30.0–36.0)
MCV: 89.9 fl (ref 78.0–100.0)
Platelets: 272 10*3/uL (ref 150.0–400.0)
RBC: 4.53 Mil/uL (ref 3.87–5.11)
RDW: 13.3 % (ref 11.5–15.5)
WBC: 7.3 10*3/uL (ref 4.0–10.5)

## 2023-07-03 LAB — TSH: TSH: 0.85 u[IU]/mL (ref 0.35–5.50)

## 2023-07-03 LAB — LDL CHOLESTEROL, DIRECT: Direct LDL: 195 mg/dL

## 2023-07-03 MED ORDER — LOSARTAN POTASSIUM 50 MG PO TABS: 50.0000 mg | ORAL_TABLET | Freq: Every day | ORAL | 3 refills | Status: DC

## 2023-07-03 NOTE — Progress Notes (Signed)
Subjective:  Patient ID: Sarah Beasley, female    DOB: 1961/03/26  Age: 62 y.o. MRN: [id number]  CC:  Chief Complaint  Patient presents with   Annual Exam    Fasting    HPI Sarah Beasley presents for Annual Exam PCP, me Sleep specialist, Dr. Frances Furbish, obstructive sleep apnea, intolerant to CPAP, plan for dental device.  Dr. Toni Arthurs. Cardiology, Dr. Duke Salvia, Gynecology, [facility]  Retired after leaving job last November.   Hypertension: Losartan 50 mg daily with option of hydralazine for elevated reasons.  History of spikes in blood pressure previously.  Family history of carcinoid syndrome but her testing was negative. Episodes of bradycardia past 2 months. Felt odd sensation  with heartbeat, HR on fitbit in 40's. Noticed these episodes past few months. Lasts seconds to a minute.  Lightheaded one time about a week ago, HR normal at that time.  No CP/dyspnea/diaphoresis. No new supplements.   Taking losartan most days (4d/week when in 130/84 range). Skips on days when BP is lower (118/74).  No recent hydralazine.  No recent cardiology eval or discussion of these sx's with cardiology. Plans to call. Low HR in past, but not sx's in past.  BP Readings from Last 3 Encounters:  07/03/23 126/80  06/06/22 128/60  11/24/21 128/78   Lab Results  Component Value Date   CREATININE 0.69 06/06/2022   Hyperlipidemia: No meds. Fasting today.  Lab Results  Component Value Date   CHOL 258 (H) 06/06/2022   HDL 64.60 06/06/2022   LDLCALC 142 (H) 09/17/2020   LDLDIRECT 145.0 06/06/2022   TRIG 294.0 (H) 06/06/2022   CHOLHDL 4 06/06/2022   Lab Results  Component Value Date   ALT 17 06/06/2022   AST 21 06/06/2022   ALKPHOS 65 06/06/2022   BILITOT 0.6 06/06/2022         07/03/2023    8:46 AM 06/06/2022    8:04 AM 11/24/2021    2:35 PM 04/01/2021   12:19 PM 11/20/2020    2:53 PM  Depression screen PHQ 2/9  Decreased Interest 0 0 0 0 0  Down, Depressed,  Hopeless 0 0 0 0 0  PHQ - 2 Score 0 0 0 0 0  Altered sleeping  1 1 1    Tired, decreased energy  1 1 1    Change in appetite  1 0 1   Feeling bad or failure about yourself   0 0 0   Trouble concentrating  0 0 0   Moving slowly or fidgety/restless  0 0 0   Suicidal thoughts  0 0 0   PHQ-9 Score  3 2 3      Health Maintenance  Topic Date Due   INFLUENZA VACCINE  02/05/2024 (Originally 06/08/2023)   COVID-19 Vaccine (6 - 2023-24 season) 07/02/2024 (Originally 10/23/2022)   PAP SMEAR-Modifier  10/09/2023   MAMMOGRAM  03/07/2025   DTaP/Tdap/Td (2 - Td or Tdap) 04/14/2026   Colonoscopy  04/10/2031   Hepatitis C Screening  Completed   HIV Screening  Completed   Zoster Vaccines- Shingrix  Completed   HPV VACCINES  Aged Out  Colonoscopy June 2022, 2 hyperplastic polyps, repeat 10 years. Mammogram 03/08/2023. [facility], [facility]. Appt with pap in 2 days.  Derm referral last year- had eval last year. Yearly visits.   Immunization History  Administered Date(s) Administered   Covid-19, Mrna,Vaccine(Spikevax)36yrs and older 08/28/2022   Hepatitis A, Adult 01/04/2018   Influenza Inj Mdck Quad Pf 08/27/2019  Influenza,inj,Quad PF,6+ Mos 09/24/2015, 01/04/2018, 12/05/2018, 08/22/2022   Influenza-Unspecified 09/02/2020, 08/23/2021   PFIZER Comirnaty(Gray Top)Covid-19 Tri-Sucrose Vaccine 01/27/2020, 02/24/2020, 09/02/2020, 08/23/2021   Rabies, IM 09/10/2018, 09/13/2018, 09/17/2018, 09/24/2018   Tdap 04/14/2016   Typhoid Live 01/04/2018   Zoster Recombinant(Shingrix) 09/17/2020, 12/24/2021  Covid infection in January.  Covid and flu vaccines when available.  RSV vaccine - today.   No results found. Optho yearly - wears glasses.   Dental:6 months, implant this week.   Alcohol: 10/week  Tobacco: none  Exercise: walking, treadmill. few times per week.  History Patient Active Problem List   Diagnosis Date Noted   Bradycardia 08/04/2021   Essential hypertension  02/01/2021   OSA (obstructive sleep apnea) 02/01/2021   Hematuria 07/07/2017   Past Medical History:  Diagnosis Date   Bradycardia 08/04/2021   Essential hypertension 02/01/2021   Heart murmur    past history   Hypertension    OSA (obstructive sleep apnea) 02/01/2021   Sleep apnea    does not wear cpap as of yet. backordered   Past Surgical History:  Procedure Laterality Date   KNEE SURGERY Left    MOUTH SURGERY     No Known Allergies Prior to Admission medications   Medication Sig Start Date End Date Taking? Authorizing Provider  augmented betamethasone dipropionate (DIPROLENE-AF) 0.05 % cream Apply 1 Application topically daily as needed. 06/27/23  Yes [provider]  hydrALAZINE (APRESOLINE) 25 MG tablet TAKE 1 TABLET BY MOUTH AS NEEDED FOR BLOOD PRESSURE ABOVE 160 09/05/22  Yes Chilton Si, MD  Ibuprofen (ADVIL PO) Take by mouth.   Yes [provider]  losartan (COZAAR) 50 MG tablet Take 1 tablet (50 mg total) by mouth daily. 06/06/22  Yes Shade Flood, MD  Multiple Vitamin (MULTIVITAMIN) tablet Take 1 tablet by mouth daily.   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    Social Determinants of Health   Financial Resource Strain: Low Risk  (08/04/2021)   Overall Financial Resource Strain (CARDIA)    Difficulty of Paying Living Expenses: Not hard at all  Food Insecurity: No Food Insecurity (08/04/2021)   Hunger Vital Sign    Worried About Running Out of Food in the Last Year: Never true    Ran Out of Food in the Last Year: Never true  Transportation Needs: No Transportation Needs (08/04/2021)   PRAPARE - Transportation  Lack of Transportation (Medical): No    Lack of Transportation (Non-Medical): No  Physical Activity: Inactive (08/04/2021)   Exercise Vital Sign    Days of Exercise per Week: 0 days    Minutes of Exercise per Session: 0 min  Stress: No Stress Concern Present (02/01/2021)   [employer] of Occupational Health - Occupational Stress Questionnaire    Feeling of Stress : Only a little  Social Connections: Not on file  Intimate Partner Violence: Not on file    Review of Systems 13 point review of systems per patient health survey noted.  Negative other than as indicated above or in HPI.    Objective:   Vitals:   07/03/23 0847  BP: 126/80  Pulse: 77  Temp: 97.6 F (36.4 C)  TempSrc: Oral  SpO2: 98%  Weight: 135 lb (61.2 kg)  Height: 5' (1.524 m)     Physical Exam Vitals reviewed.  Constitutional:      Appearance: She is well-developed.  HENT:     Head: Normocephalic and atraumatic.     Right Ear: External ear normal.     Left Ear: External ear normal.  Eyes:     Conjunctiva/sclera: Conjunctivae normal.     Pupils: Pupils are equal, round, and reactive to light.  Neck:     Thyroid: No thyromegaly.  Cardiovascular:     Rate and Rhythm: Normal rate and regular rhythm.     Heart sounds: Normal heart sounds. No murmur heard.    Comments: No ectopy.  Pulmonary:     Effort: Pulmonary effort is normal. No  respiratory distress.     Breath sounds: Normal breath sounds. No wheezing.  Abdominal:     General: Bowel sounds are normal.     Palpations: Abdomen is soft.     Tenderness: There is no abdominal tenderness.  Musculoskeletal:        General: No tenderness. Normal range of motion.     Cervical back: Normal range of motion and neck supple.  Lymphadenopathy:     Cervical: No cervical adenopathy.  Skin:    General: Skin is warm and dry.     Findings: No rash.  Neurological:     Mental Status: She is alert and oriented to person, place, and time.  Psychiatric:        Behavior: Behavior normal.        Thought Content: Thought content normal.      EKG, sinus bradycardia with rate 54, PR 138, QTc 415, no acute ST or T wave changes appreciated.Compared to 08/04/2021, no appreciable significant changes.  Assessment & Plan:  Sarah Beasley is a 62 y.o. female . Annual physical exam  - -anticipatory guidance as below in AVS, screening labs above. Health maintenance items as above in HPI discussed/recommended as applicable.   Essential hypertension - Plan: CBC, TSH, EKG 12-Lead  -Borderline control based on home readings, intermittent dosing of losartan depending on her home readings.  Option of 25 mg dosing but given intermittent palpitations and bradycardia and planned follow-up with cardiology will defer med changes for now.  Bradycardia - Plan: CBC, TSH, EKG 12-Lead Palpitations - Plan: CBC, TSH, EKG 12-Lead  -As above, noted intermittent episodes few times per week over last 2 months.  Asymptomatic in office.  ER precautions given/RTC precautions given, she plans to call cardiology, likely will have Zio patch but will defer discussion to her cardiologist.  Check TSH, CBC, BMP.  Mixed hyperlipidemia - Plan: Comprehensive metabolic panel, Lipid  panel Check labs and decide on treatment based on results, ASCVD risk scoring.  No orders of the defined types were placed in this  encounter.  Patient Instructions  Please call cardiology for office visit to discuss the low heart rates and palpitations symptoms.  If any associated lightheadedness, dizziness, sweating, chest pain, or shortness of breath be seen right away.  I do not expect that to occur.  If there are concerns on labs I will let you know.  No med changes for me at this time but if you continue to have borderline low blood pressures we may need to decrease the dose of losartan.  This can also be discussed with your cardiologist.  Return to the clinic or go to the nearest emergency room if any of your symptoms worsen or new symptoms occur.  Preventive Care 35-25 Years Old, Female Preventive care refers to lifestyle choices and visits with your health care provider that can promote health and wellness. Preventive care visits are also called wellness exams. What can I expect for my preventive care visit? Counseling Your health care provider may ask you questions about your: Medical history, including: Past medical problems. Family medical history. Pregnancy history. Current health, including: Menstrual cycle. Method of birth control. Emotional well-being. Home life and relationship well-being. Sexual activity and sexual health. Lifestyle, including: Alcohol, nicotine or tobacco, and drug use. Access to firearms. Diet, exercise, and sleep habits. Work and work Astronomer. Sunscreen use. Safety issues such as seatbelt and bike helmet use. Physical exam Your health care provider will check your: Height and weight. These may be used to calculate your BMI (body mass index). BMI is a measurement that tells if you are at a healthy weight. Waist circumference. This measures the distance around your waistline. This measurement also tells if you are at a healthy weight and may help predict your risk of certain diseases, such as type 2 diabetes and high blood pressure. Heart rate and blood pressure. Body  temperature. Skin for abnormal spots. What immunizations do I need?  Vaccines are usually given at various ages, according to a schedule. Your health care provider will recommend vaccines for you based on your age, medical history, and lifestyle or other factors, such as travel or where you work. What tests do I need? Screening Your health care provider may recommend screening tests for certain conditions. This may include: Lipid and cholesterol levels. Diabetes screening. This is done by checking your blood sugar (glucose) after you have not eaten for a while (fasting). Pelvic exam and Pap test. Hepatitis B test. Hepatitis C test. HIV (human immunodeficiency virus) test. STI (sexually transmitted infection) testing, if you are at risk. Lung cancer screening. Colorectal cancer screening. Mammogram. Talk with your health care provider about when you should start having regular mammograms. This may depend on whether you have a family history of breast cancer. BRCA-related cancer screening. This may be done if you have a family history of breast, ovarian, tubal, or peritoneal cancers. Bone density scan. This is done to screen for osteoporosis. Talk with your health care provider about your test results, treatment options, and if necessary, the need for more tests. Follow these instructions at home: Eating and drinking  Eat a diet that includes fresh fruits and vegetables, whole grains, lean protein, and low-fat dairy products. Take vitamin and mineral supplements as recommended by your health care provider. Do not drink alcohol if: Your health care provider tells you not to drink. You are pregnant, may be  pregnant, or are planning to become pregnant. If you drink alcohol: Limit how much you have to 0-1 drink a day. Know how much alcohol is in your drink. In the [place] one drink equals one 12 oz bottle of beer (355 mL), one 5 oz glass of wine (148 mL), or one 1 oz glass of hard liquor (44  mL). Lifestyle Brush your teeth every morning and night with fluoride toothpaste. Floss one time each day. Exercise for at least 30 minutes 5 or more days each week. Do not use any products that contain nicotine or tobacco. These products include cigarettes, chewing tobacco, and vaping devices, such as e-cigarettes. If you need help quitting, ask your health care provider. Do not use drugs. If you are sexually active, practice safe sex. Use a condom or other form of protection to prevent STIs. If you do not wish to become pregnant, use a form of birth control. If you plan to become pregnant, see your health care provider for a prepregnancy visit. Take aspirin only as told by your health care provider. Make sure that you understand how much to take and what form to take. Work with your health care provider to find out whether it is safe and beneficial for you to take aspirin daily. Find healthy ways to manage stress, such as: Meditation, yoga, or listening to music. Journaling. Talking to a trusted person. Spending time with friends and family. Minimize exposure to UV radiation to reduce your risk of skin cancer. Safety Always wear your seat belt while driving or riding in a vehicle. Do not drive: If you have been drinking alcohol. Do not ride with someone who has been drinking. When you are tired or distracted. While texting. If you have been using any mind-altering substances or drugs. Wear a helmet and other protective equipment during sports activities. If you have firearms in your house, make sure you follow all gun safety procedures. Seek help if you have been physically or sexually abused. What's next? Visit your health care provider once a year for an annual wellness visit. Ask your health care provider how often you should have your eyes and teeth checked. Stay up to date on all vaccines. This information is not intended to replace advice given to you by your health care  provider. Make sure you discuss any questions you have with your health care provider. Document Revised: 04/21/2021 Document Reviewed: 04/21/2021 Elsevier Patient Education  2024 [employer]     Signed,   Meredith Staggers, MD  Primary Care, [facility] 07/03/23 9:36 AM

## 2023-07-03 NOTE — Patient Instructions (Addendum)
Please call cardiology for office visit to discuss the low heart rates and palpitations symptoms.  If any associated lightheadedness, dizziness, sweating, chest pain, or shortness of breath be seen right away.  I do not expect that to occur.  If there are concerns on labs I will let you know.  No med changes for me at this time but if you continue to have borderline low blood pressures we may need to decrease the dose of losartan.  This can also be discussed with your cardiologist.  Return to the clinic or go to the nearest emergency room if any of your symptoms worsen or new symptoms occur.  Palpitations Palpitations are feelings that your heartbeat is irregular or is faster than normal. It may feel like your heart is fluttering or skipping a beat. Palpitations may be caused by many things, including smoking, caffeine, alcohol, stress, and certain medicines or drugs. Most causes of palpitations are not serious.  However, some palpitations can be a sign of a serious problem. Further tests and a thorough medical history will be done to find the cause of your palpitations. Your provider may order tests such as an ECG, labs, an echocardiogram, or an ambulatory continuous ECG monitor. Follow these instructions at home: Pay attention to any changes in your symptoms. Let your health care provider know about them. Take these actions to help manage your symptoms: Eating and drinking Follow instructions from your health care provider about eating or drinking restrictions. You may need to avoid foods and drinks that may cause palpitations. These may include: Caffeinated coffee, tea, soft drinks, and energy drinks. Chocolate. Alcohol. Diet pills. Lifestyle     Take steps to reduce your stress and anxiety. Things that can help you relax include: Yoga. Mind-body activities, such as deep breathing, meditation, or using words and images to create positive thoughts (guided imagery). Physical activity, such as  swimming, jogging, or walking. Tell your health care provider if your palpitations increase with activity. If you have chest pain or shortness of breath with activity, do not continue the activity until you are seen by your health care provider. Biofeedback. This is a method that helps you learn to use your mind to control things in your body, such as your heartbeat. Get plenty of rest and sleep. Keep a regular bed time. Do not use drugs, including cocaine or ecstasy. Do not use marijuana. Do not use any products that contain nicotine or tobacco. These products include cigarettes, chewing tobacco, and vaping devices, such as e-cigarettes. If you need help quitting, ask your health care provider. General instructions Take over-the-counter and prescription medicines only as told by your health care provider. Keep all follow-up visits. This is important. These may include visits for further testing if palpitations do not go away or get worse. Contact a health care provider if: You continue to have a fast or irregular heartbeat for a long period of time. You notice that your palpitations occur more often. Get help right away if: You have chest pain or shortness of breath. You have a severe headache. You feel dizzy or you faint. These symptoms may represent a serious problem that is an emergency. Do not wait to see if the symptoms will go away. Get medical help right away. Call your local emergency services (911 in the [place] Do not drive yourself to the hospital. Summary Palpitations are feelings that your heartbeat is irregular or is faster than normal. It may feel like your heart is fluttering or skipping  a beat. Palpitations may be caused by many things, including smoking, caffeine, alcohol, stress, certain medicines, and drugs. Further tests and a thorough medical history may be done to find the cause of your palpitations. Get help right away if you faint or have chest pain, shortness of breath,  severe headache, or dizziness. This information is not intended to replace advice given to you by your health care provider. Make sure you discuss any questions you have with your health care provider. Document Revised: 03/17/2021 Document Reviewed: 03/17/2021 Elsevier Patient Education  2024 [employer].   Preventive Care 85-16 Years Old, Female Preventive care refers to lifestyle choices and visits with your health care provider that can promote health and wellness. Preventive care visits are also called wellness exams. What can I expect for my preventive care visit? Counseling Your health care provider may ask you questions about your: Medical history, including: Past medical problems. Family medical history. Pregnancy history. Current health, including: Menstrual cycle. Method of birth control. Emotional well-being. Home life and relationship well-being. Sexual activity and sexual health. Lifestyle, including: Alcohol, nicotine or tobacco, and drug use. Access to firearms. Diet, exercise, and sleep habits. Work and work Astronomer. Sunscreen use. Safety issues such as seatbelt and bike helmet use. Physical exam Your health care provider will check your: Height and weight. These may be used to calculate your BMI (body mass index). BMI is a measurement that tells if you are at a healthy weight. Waist circumference. This measures the distance around your waistline. This measurement also tells if you are at a healthy weight and may help predict your risk of certain diseases, such as type 2 diabetes and high blood pressure. Heart rate and blood pressure. Body temperature. Skin for abnormal spots. What immunizations do I need?  Vaccines are usually given at various ages, according to a schedule. Your health care provider will recommend vaccines for you based on your age, medical history, and lifestyle or other factors, such as travel or where you work. What tests do I  need? Screening Your health care provider may recommend screening tests for certain conditions. This may include: Lipid and cholesterol levels. Diabetes screening. This is done by checking your blood sugar (glucose) after you have not eaten for a while (fasting). Pelvic exam and Pap test. Hepatitis B test. Hepatitis C test. HIV (human immunodeficiency virus) test. STI (sexually transmitted infection) testing, if you are at risk. Lung cancer screening. Colorectal cancer screening. Mammogram. Talk with your health care provider about when you should start having regular mammograms. This may depend on whether you have a family history of breast cancer. BRCA-related cancer screening. This may be done if you have a family history of breast, ovarian, tubal, or peritoneal cancers. Bone density scan. This is done to screen for osteoporosis. Talk with your health care provider about your test results, treatment options, and if necessary, the need for more tests. Follow these instructions at home: Eating and drinking  Eat a diet that includes fresh fruits and vegetables, whole grains, lean protein, and low-fat dairy products. Take vitamin and mineral supplements as recommended by your health care provider. Do not drink alcohol if: Your health care provider tells you not to drink. You are pregnant, may be pregnant, or are planning to become pregnant. If you drink alcohol: Limit how much you have to 0-1 drink a day. Know how much alcohol is in your drink. In the [place] one drink equals one 12 oz bottle of beer (355 mL), one  5 oz glass of wine (148 mL), or one 1 oz glass of hard liquor (44 mL). Lifestyle Brush your teeth every morning and night with fluoride toothpaste. Floss one time each day. Exercise for at least 30 minutes 5 or more days each week. Do not use any products that contain nicotine or tobacco. These products include cigarettes, chewing tobacco, and vaping devices, such as  e-cigarettes. If you need help quitting, ask your health care provider. Do not use drugs. If you are sexually active, practice safe sex. Use a condom or other form of protection to prevent STIs. If you do not wish to become pregnant, use a form of birth control. If you plan to become pregnant, see your health care provider for a prepregnancy visit. Take aspirin only as told by your health care provider. Make sure that you understand how much to take and what form to take. Work with your health care provider to find out whether it is safe and beneficial for you to take aspirin daily. Find healthy ways to manage stress, such as: Meditation, yoga, or listening to music. Journaling. Talking to a trusted person. Spending time with friends and family. Minimize exposure to UV radiation to reduce your risk of skin cancer. Safety Always wear your seat belt while driving or riding in a vehicle. Do not drive: If you have been drinking alcohol. Do not ride with someone who has been drinking. When you are tired or distracted. While texting. If you have been using any mind-altering substances or drugs. Wear a helmet and other protective equipment during sports activities. If you have firearms in your house, make sure you follow all gun safety procedures. Seek help if you have been physically or sexually abused. What's next? Visit your health care provider once a year for an annual wellness visit. Ask your health care provider how often you should have your eyes and teeth checked. Stay up to date on all vaccines. This information is not intended to replace advice given to you by your health care provider. Make sure you discuss any questions you have with your health care provider. Document Revised: 04/21/2021 Document Reviewed: 04/21/2021 Elsevier Patient Education  2024 [employer].

## 2023-07-07 ENCOUNTER — Other Ambulatory Visit (HOSPITAL_BASED_OUTPATIENT_CLINIC_OR_DEPARTMENT_OTHER): Payer: 59

## 2023-07-07 ENCOUNTER — Ambulatory Visit (INDEPENDENT_AMBULATORY_CARE_PROVIDER_SITE_OTHER): Payer: 59 | Admitting: Cardiovascular Disease

## 2023-07-07 ENCOUNTER — Encounter (HOSPITAL_BASED_OUTPATIENT_CLINIC_OR_DEPARTMENT_OTHER): Payer: Self-pay | Admitting: Cardiovascular Disease

## 2023-07-07 VITALS — BP 140/80 | HR 63 | Ht 60.0 in | Wt 137.4 lb

## 2023-07-07 DIAGNOSIS — I10 Essential (primary) hypertension: Secondary | ICD-10-CM

## 2023-07-07 DIAGNOSIS — G4733 Obstructive sleep apnea (adult) (pediatric): Secondary | ICD-10-CM | POA: Diagnosis not present

## 2023-07-07 DIAGNOSIS — E7801 Familial hypercholesterolemia: Secondary | ICD-10-CM

## 2023-07-07 DIAGNOSIS — R001 Bradycardia, unspecified: Secondary | ICD-10-CM

## 2023-07-07 DIAGNOSIS — Z5181 Encounter for therapeutic drug level monitoring: Secondary | ICD-10-CM

## 2023-07-07 HISTORY — DX: Familial hypercholesterolemia: E78.01

## 2023-07-07 MED ORDER — ROSUVASTATIN CALCIUM 10 MG PO TABS: 10.0000 mg | ORAL_TABLET | Freq: Every day | ORAL | 3 refills | Status: DC
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
## 2023-07-07 NOTE — Patient Instructions (Addendum)
Medication Instructions:  START ROSUVASTATIN 10 MG DAILY   *If you need a refill on your cardiac medications before your next appointment, please call your pharmacy*  Lab Work: FASTING LP/CMET IN 2-3 MONTHS   If you have labs (blood work) drawn today and your tests are completely normal, you will receive your results only by: MyChart Message (if you have MyChart) OR A paper copy in the mail If you have any lab test that is abnormal or we need to change your treatment, we will call you to review the results.  Testing/Procedures: [facility] HOME SLEEP STUDY  THE OFFICE WILL CALL YOU WITH ACTIVATION CODE   14 DAY ZIO   Follow-Up: 09/01/2023 AT 10:55 AM WITH Ronn Melena NP  Other Instructions  WatchPAT?  Is a FDA cleared portable home sleep study test that uses a watch and 3 points of contact to monitor 7 different channels, including your heart rate, oxygen saturations, body position, snoring, and chest motion.  The study is easy to use from the comfort of your own home and accurately detect sleep apnea.  Before bed, you attach the chest sensor, attached the sleep apnea bracelet to your nondominant hand, and attach the finger probe.  After the study, the raw data is downloaded from the watch and scored for apnea events.   For more information: [email or web address]  Patient Testing Instructions:  Do not put battery into the device until bedtime when you are ready to begin the test. Please call the support number if you need assistance after following the instructions below: 24 hour support line- [phone] or [facility] support at [phone] (option 2)  Download the IntelWatchPAT One" app through the google play store or App Store  Be sure to turn on or enable access to bluetooth in settlings on your smartphone/ device  Make sure no other bluetooth devices are on and within the vicinity of your smartphone/ device and WatchPAT watch during testing.  Make sure to leave  your smart phone/ device plugged in and charging all night.  When ready for bed:  Follow the instructions step by step in the WatchPAT One App to activate the testing device. For additional instructions, including video instruction, visit the WatchPAT One video on Youtube. You can search for WatchPat One within Youtube (video is 4 minutes and 18 seconds) or enter: https://youtube/watch?[email or web address]_vbiwxE Please note: You will be prompted to enter a Pin to connect via bluetooth when starting the test. The PIN will be assigned to you when you receive the test.  The device is disposable, but it recommended that you retain the device until you receive a call letting you know the study has been received and the results have been interpreted.  We will let you know if the study did not transmit to [place] properly after the test is completed. You do not need to call us to confirm the receipt of the test.  Please complete the test within 48 hours of receiving PIN.   Frequently Asked Questions:  What is Watch Dennie Bible one?  A single use fully disposable home sleep apnea testing device and will not need to be returned after completion.  What are the requirements to use WatchPAT one?  The be able to have a successful watchpat one sleep study, you should have your Watch pat one device, your smart phone, watch pat one app, your PIN number and Internet access What type of phone do I need?  You should have a smart phone that uses Android 5.1  and above or any Iphone with IOS 10 and above How can I download the WatchPAT one app?  Based on your device type search for WatchPAT one app either in google play for android devices or APP store for Iphone's Where will I get my PIN for the study?  Your PIN will be provided by your physician's office. It is used for authentication and if you lose/forget your PIN, please reach out to your providers office.  I do not have Internet at home. Can I do WatchPAT one study?  WatchPAT One needs  Internet connection throughout the night to be able to transmit the sleep data. You can use your home/local internet or your cellular's data package. However, it is always recommended to use home/local Internet. It is estimated that between 20MB-30MB will be used with each study.However, the application will be looking for space in the phone to start the study.  What happens if I lose internet or bluetooth connection?  During the internet disconnection, your phone will not be able to transmit the sleep data. All the data, will be stored in your phone. As soon as the internet connection is back on, the phone will being sending the sleep data. During the bluetooth disconnection, WatchPAT one will not be able to to send the sleep data to your phone. Data will be kept in the [facility] one until two devices have bluetooth connection back on. As soon as the connection is back on, WatchPAT one will send the sleep data to the phone.  How long do I need to wear the WatchPAT one?  After you start the study, you should wear the device at least 6 hours.  How far should I keep my phone from the device?  During the night, your phone should be within 15 feet.  What happens if I leave the room for restroom or other reasons?  Leaving the room for any reason will not cause any problem. As soon as your get back to the room, both devices will reconnect and will continue to send the sleep data. Can I use my phone during the sleep study?  Yes, you can use your phone as usual during the study. But it is recommended to put your watchpat one on when you are ready to go to bed.  How will I get my study results?  A soon as you completed your study, your sleep data will be sent to the provider. They will then share the results with you when they are ready.   ZIO XT- Long Term Monitor Instructions  Your physician has requested you wear a ZIO patch monitor for 14 days.  This is a single patch monitor. [employer] supplies one  patch monitor per enrollment. Additional stickers are not available. Please do not apply patch if you will be having a Nuclear Stress Test,  Echocardiogram, Cardiac CT, MRI, or Chest Xray during the period you would be wearing the  monitor. The patch cannot be worn during these tests. You cannot remove and re-apply the  ZIO XT patch monitor.  Your ZIO patch monitor will be mailed 3 day USPS to your address on file. It may take 3-5 days  to receive your monitor after you have been enrolled.  Once you have received your monitor, please review the enclosed instructions. Your monitor  has already been registered assigning a specific monitor serial # to you.  Billing and Patient Assistance Program Information  We have supplied [employer] with any of your insurance  information on file for billing purposes. [employer] offers a sliding scale Patient Assistance Program for patients that do not have  insurance, or whose insurance does not completely cover the cost of the ZIO monitor.  You must apply for the Patient Assistance Program to qualify for this discounted rate.  To apply, please call [employer] at [phone], select option 4, select option 2, ask to apply for  Patient Assistance Program. Meredeth Ide will ask your household income, and how many people  are in your household. They will quote your out-of-pocket cost based on that information.  [employer] will also be able to set up a 20-month, interest-free payment plan if needed.  Applying the monitor   Shave hair from upper left chest.  Hold abrader disc by orange tab. Rub abrader in 40 strokes over the upper left chest as  indicated in your monitor instructions.  Clean area with 4 enclosed alcohol pads. Let dry.  Apply patch as indicated in monitor instructions. Patch will be placed under collarbone on left  side of chest with arrow pointing upward.  Rub patch adhesive wings for 2 minutes. Remove white label marked "1". Remove the white  label marked  "2". Rub patch adhesive wings for 2 additional minutes.  While looking in a mirror, press and release button in center of patch. A small green light will  flash 3-4 times. This will be your only indicator that the monitor has been turned on.  Do not shower for the first 24 hours. You may shower after the first 24 hours.  Press the button if you feel a symptom. You will hear a small click. Record Date, Time and  Symptom in the Patient Logbook.  When you are ready to remove the patch, follow instructions on the last 2 pages of Patient  Logbook. Stick patch monitor onto the last page of Patient Logbook.  Place Patient Logbook in the blue and white box. Use locking tab on box and tape box closed  securely. The blue and white box has prepaid postage on it. Please place it in the mailbox as  soon as possible. Your physician should have your test results approximately 7 days after the  monitor has been mailed back to [facility].  Call [facility] Customer Care at [phone] if you have questions regarding  your ZIO XT patch monitor. Call them immediately if you see an orange light blinking on your  monitor.  If your monitor falls off in less than 4 days, contact our Monitor department at [phone].  If your monitor becomes loose or falls off after 4 days call [employer] at [phone] for  suggestions on securing your monitor

## 2023-07-17 ENCOUNTER — Telehealth: Payer: 59 | Admitting: Family Medicine

## 2023-07-27 ENCOUNTER — Encounter (HOSPITAL_BASED_OUTPATIENT_CLINIC_OR_DEPARTMENT_OTHER): Payer: Self-pay | Admitting: Cardiovascular Disease

## 2023-09-01 ENCOUNTER — Telehealth: Payer: Self-pay

## 2023-09-01 ENCOUNTER — Ambulatory Visit (HOSPITAL_BASED_OUTPATIENT_CLINIC_OR_DEPARTMENT_OTHER): Payer: 59 | Admitting: Family

## 2023-09-01 ENCOUNTER — Encounter (HOSPITAL_BASED_OUTPATIENT_CLINIC_OR_DEPARTMENT_OTHER): Payer: Self-pay | Admitting: Family

## 2023-09-01 VITALS — BP 118/76 | HR 60 | Ht 60.0 in | Wt 131.0 lb

## 2023-09-01 DIAGNOSIS — I10 Essential (primary) hypertension: Secondary | ICD-10-CM

## 2023-09-01 DIAGNOSIS — E782 Mixed hyperlipidemia: Secondary | ICD-10-CM

## 2023-09-01 DIAGNOSIS — R001 Bradycardia, unspecified: Secondary | ICD-10-CM

## 2023-09-01 DIAGNOSIS — G4733 Obstructive sleep apnea (adult) (pediatric): Secondary | ICD-10-CM | POA: Diagnosis not present

## 2023-09-01 DIAGNOSIS — I472 Ventricular tachycardia, unspecified: Secondary | ICD-10-CM

## 2023-09-01 DIAGNOSIS — I471 Supraventricular tachycardia, unspecified: Secondary | ICD-10-CM

## 2023-09-01 NOTE — Patient Instructions (Addendum)
Medication Instructions:  Your physician recommends that you continue on your current medications as directed. Please refer to the Current Medication list given to you today.  Lab Work: Labs within the next month  Follow-Up: At [facility], you and your health needs are our priority.  As part of our continuing mission to provide you with exceptional heart care, we have created designated Provider Care Teams.  These Care Teams include your primary Cardiologist (physician) and Advanced Practice Providers (APPs -  Physician Assistants and Nurse Practitioners) who all work together to provide you with the care you need, when you need it.  We recommend signing up for the patient portal called "MyChart".  Sign up information is provided on this After Visit Summary.  MyChart is used to connect with patients for Virtual Visits (Telemedicine).  Patients are able to view lab/test results, encounter notes, upcoming appointments, etc.  Non-urgent messages can be sent to your provider as well.   To learn more about what you can do with MyChart, go to [email or web address].    Your next appointment:   Follow up with Gillian Shields, NP in 6 months.  Other Information: Your monitor showed your heart occasionally would speed up and then go back to normal. This is not concerning and the episodes were very short. When we get your sleep apnea treated it will help these not to happen as often.   To prevent these abnormal hearty rhythms: Make sure you are adequately hydrated.  Avoid and/or limit caffeine containing beverages like soda or tea. Exercise regularly.  Manage stress well. Some over the counter medications can cause palpitations such as Benadryl, AdvilPM, TylenolPM. Regular Advil or Tylenol do not cause palpitations.

## 2023-09-01 NOTE — Telephone Encounter (Signed)
Sarah Beasley,  Just FYI:  Instructions for covering staff:  1. Please contact patient in 2 weeks if WatchPAT study results are not available yet. Remind patient to complete test.  2. If patient declines to proceed with test, please confirm that box is unopened and remind patient to return it to the office within 30 days. Route phone note to [facility] SLEEP STUDIES pool for tracking.  3. If box has been opened, please route phone note to Erskine Squibb (billing department).

## 2023-09-01 NOTE — Telephone Encounter (Signed)
**Note De-Identified Sarah Beasley Obfuscation** Ordering provider: Dr Duke Salvia Associated diagnoses: OSA-G47.33 and Insomnia-G47.0 WatchPAT PA obtained on 09/01/2023 by Magnus Crescenzo, Lorelle Formosa, LPN. Authorization: [facility] Provider Portal-Decision ID #: [id number] Authorization/Notification is not required for the requested service(s). Patient notified of PIN (1234) on 09/01/2023 Sarah Beasley Notification Method: phone. I did request that she do her HST within 2 weeks from today's date, if possible and she stated that she will.  Phone note routed to covering staff for follow-up.

## 2023-09-01 NOTE — Progress Notes (Signed)
Cardiology Office Note:  .   Date:  09/01/2023  ID:  Sarah Beasley, DOB 04-Apr-1961, MRN [id number] PCP: Shade Flood, MD  [facility] Providers Cardiologist:  Chilton Si, MD    History of Present Illness: Sarah Beasley Kitchen   Leotha Shorr is a 62 y.o. female with history of hypertension, OSA not on CPAP.  [facility].  Diagnosed with hypertension 2015.  Myoview 2015 negative.  PCP had recently increased losartan from 25 to 50 mg.  Family history of neuroendocrine tumors (carcinoid) and was referred to advanced hypertension clinic. Urine 5-HIAA was normal. Given Hydralazine PRN for spikes in BP.   Seen 07/07/23 after being rereferred to cardiology for bradycardia. 14-day ZIO ordered due to bradycardia.  Rosuvastatin 10 mg daily started and repeat FLP/CMP recommended in 2 to 3 months.  She was referred to ENT for consideration of inspire device. [facility] home sleep study also provided.  ZIO with predominantly normal sinus rhythm.  3 episodes of NSVT up to 19 beats at maximum rate to 31 bpm and 26 episodes of SVT up to 6 beats.  Rare PVC/PAC.  Average heart rate 67 bpm.  Triggered episodes prominently associated with normal sinus rhythm.  No prolonged bradycardia requiring PPM.  Given heart rate was low at time AV nodal blocking agent deferred and was recommended continue to try and treat sleep apnea.  Presents today for follow up independently. Reports feeling better since last visit with BP and heart rate more stable. Her home is undergoing renovations including her kitchen but still preparing healthy foods at home as much as possible. Has joined [employer] and exercising more regularly. Tolerating Rosuvastatin with minimal myalgia, about once per week might have myalgia and skip a dose.   ROS: Please see the history of present illness.    All other systems reviewed and are negative.   Studies Reviewed: .        Cardiac Studies &  Procedures         MONITORS  LONG TERM MONITOR (3-14 DAYS) 08/01/2023  Narrative 14 Day Zio Monitor  Quality: Fair.  Baseline artifact. Predominant rhythm: sinus rhythm Average heart rate: 67 bpm Max heart rate: 136 bpm Min heart rate: 38 bpm Pauses >2.5 seconds: none   3 episodes of NSVT lasting up to 19 beats at a maximum rate of 231 bpm. 26 episodes of SVT lasting up to 6 beats Rare (<1%) PACs and PVCs Rare ventricular bigeminy  Tiffany C. Duke Salvia, MD, [facility] 08/21/2023 3:30 PM           Risk Assessment/Calculations:             Physical Exam:   VS:  BP 118/76   Pulse 60   Ht 5' (1.524 m)   Wt 131 lb (59.4 kg)   LMP 03/29/2021   SpO2 100%   BMI 25.58 kg/m    Wt Readings from Last 3 Encounters:  09/01/23 131 lb (59.4 kg)  07/07/23 137 lb 6.4 oz (62.3 kg)  07/03/23 135 lb (61.2 kg)    GEN: Well nourished, well developed in no acute distress NECK: No JVD; No carotid bruits CARDIAC: RRR, no murmurs, rubs, gallops RESPIRATORY:  Clear to auscultation without rales, wheezing or rhonchi  ABDOMEN: Soft, non-tender, non-distended EXTREMITIES:  No edema; No deformity   ASSESSMENT AND PLAN: .    Bradycardia/SVT/VT-prior labs including thyroid function, electrolytes were unremarkable.  ZIO 06/2023 predominantly normal sinus rhythm, 3 episodes NSVT up to  19 beats at rate of 231 ppm, 26 episodes of SVT up to 6 beats.  No significant bradycardia nor pauses.  Triggered episodes predominantly associated with normal sinus rhythm. Reports no palpitations. Anticipate arrhythmia secondary to untreated OSA, as below.   HTN- BP well controlled. Continue current antihypertensive regimen.    HLD-rosuvastatin initiated 07/07/2023.  Already has lab orders for repeat fasting lipid panel in 2 to 3 months. Recommend aiming for 150 minutes of moderate intensity activity per week and following a heart healthy diet.    OSA-previously did not tolerate CPAP. [facility] home sleep study provided  at last OV. Coordinated with sleep team during clinic visit, prior auth approved and she will complete home sleep study. Has been referred to ENT for consideration of inspire device.       Dispo: follow up in 6 mos  Signed, Alver Sorrow, NP

## 2023-09-05 ENCOUNTER — Encounter: Payer: Self-pay | Admitting: Family Medicine

## 2023-09-10 ENCOUNTER — Encounter (INDEPENDENT_AMBULATORY_CARE_PROVIDER_SITE_OTHER): Payer: 59 | Admitting: Cardiology

## 2023-09-10 DIAGNOSIS — G4733 Obstructive sleep apnea (adult) (pediatric): Secondary | ICD-10-CM

## 2023-09-13 ENCOUNTER — Ambulatory Visit: Payer: 59 | Attending: Cardiovascular Disease

## 2023-09-13 DIAGNOSIS — R001 Bradycardia, unspecified: Secondary | ICD-10-CM

## 2023-09-13 DIAGNOSIS — G4733 Obstructive sleep apnea (adult) (pediatric): Secondary | ICD-10-CM

## 2023-09-13 DIAGNOSIS — I10 Essential (primary) hypertension: Secondary | ICD-10-CM

## 2023-09-13 NOTE — Procedures (Addendum)
SLEEP STUDY REPORT Patient Information Study Date: 09/10/2023 Patient Name: Sarah Beasley Patient ID: [id number] Birth Date: [birth year] Age: 62 Gender: Female BMI: 26.8 (W=137 lb, H=5' 0'') Referring Physician: Chilton Si, MD  TEST DESCRIPTION: Home sleep apnea testing was completed using the WatchPat, a Type 1 device, utilizing  peripheral arterial tonometry (PAT), chest movement, actigraphy, pulse oximetry, pulse rate, body position and snore.  AHI was calculated with apnea and hypopnea using valid sleep time as the denominator. RDI includes apneas,  hypopneas, and RERAs. The data acquired and the scoring of sleep and all associated events were performed in  accordance with the recommended standards and specifications as outlined in the AASM Manual for the Scoring of  Sleep and Associated Events 2.2.0 (2015).   FINDINGS:   1. Mild Obstructive Sleep Apnea with AHI 11.6/hr overall and moderate during REM sleep with REM AHI 15.9/hr. Most  events occur in the supine position.  2. No Central Sleep Apnea with pAHIc 1.2/hr.   3. Oxygen desaturations as low as 90%.   4. Severe snoring was present. O2 sats were < 88% for 0 min.   5. Total sleep time was 5 hrs and 0 min.   6. 17% of total sleep time was spent in REM sleep.   7. Shortened sleep onset latency at 6 min.   8. Shortened REM sleep onset latency at 34 min.   9. Total awakenings were 7.  10. Arrhythmia detection: None  DIAGNOSIS: Mild Obstructive Sleep Apnea (G47.33)  RECOMMENDATIONS: 1. Clinical correlation of these findings is necessary. The decision to treat obstructive sleep apnea (OSA) is usually  based on the presence of apnea symptoms or the presence of associated medical conditions such as Hypertension,  Congestive Heart Failure, Atrial Fibrillation or Obesity. The most common symptoms of OSA are snoring, gasping for  breath while sleeping, daytime sleepiness and fatigue.   2. Initiating apnea therapy is  recommended given the presence of symptoms and/or associated conditions.  Recommend proceeding with one of the following:   a. Auto-CPAP therapy with a pressure range of 5-20cm H2O.   b. An oral appliance (OA) that can be obtained from certain dentists with expertise in sleep medicine. These are  primarily of use in non-obese patients with mild and moderate disease.   c. An ENT consultation which may be useful to look for specific causes of obstruction and possible treatment  options.   d. If patient is intolerant to PAP therapy, consider referral to ENT for evaluation for hypoglossal nerve stimulator.   3. Close follow-up is necessary to ensure success with CPAP or oral appliance therapy for maximum benefit .  4. A follow-up oximetry study on CPAP is recommended to assess the adequacy of therapy and determine the need  for supplemental oxygen or the potential need for Bi-level therapy. An arterial blood gas to determine the adequacy of  baseline ventilation and oxygenation should also be considered  5. Healthy sleep recommendations include: adequate nightly sleep (normal 7-9 hrs/night), avoidance of caffeine after  noon and alcohol near bedtime, and maintaining a sleep environment that is cool, dark and quiet.  6. Weight loss for overweight patients is recommended. Even modest amounts of weight loss can significantly  improve the severity of sleep apnea.  7. Snoring recommendations include: weight loss where appropriate, side sleeping, and avoidance of alcohol before  bed.  8. Operation of motor vehicle should be avoided when sleepy.  Signature: Armanda Magic, MD; [facility]; Diplomat, American Board of Sleep  Medicine Electronically Signed: 09/13/2023 12:01:00 P

## 2023-09-29 ENCOUNTER — Telehealth: Payer: Self-pay | Admitting: *Deleted

## 2023-09-29 NOTE — Telephone Encounter (Addendum)
PER DPR The patient has been notified of the result. Left detailed message on voicemail and informed patient to call back. Latrelle Dodrill, CMA.  Return Call:  The patient has been notified of the result and verbalized understanding.  All questions (if any) were answered. Latrelle Dodrill, CMA 09/29/2023 6:10 PM    Will send a message to the scheduler (leah) to call the patient for an appointment.

## 2023-09-29 NOTE — Telephone Encounter (Signed)
-----   Message from Armanda Magic sent at 09/13/2023 11:54 AM EST ----- Patient has very mild OSA - set up OV to discuss treatment options.

## 2023-10-04 LAB — COMPREHENSIVE METABOLIC PANEL
ALT: 16 [IU]/L (ref 0–32)
AST: 19 [IU]/L (ref 0–40)
Albumin: 4.6 g/dL (ref 3.9–4.9)
Alkaline Phosphatase: 66 [IU]/L (ref 44–121)
BUN/Creatinine Ratio: 17 (ref 12–28)
BUN: 11 mg/dL (ref 8–27)
Bilirubin Total: 0.5 mg/dL (ref 0.0–1.2)
CO2: 22 mmol/L (ref 20–29)
Calcium: 9.9 mg/dL (ref 8.7–10.3)
Chloride: 103 mmol/L (ref 96–106)
Creatinine, Ser: 0.66 mg/dL (ref 0.57–1.00)
Globulin, Total: 2.1 g/dL (ref 1.5–4.5)
Glucose: 99 mg/dL (ref 70–99)
Potassium: 4.6 mmol/L (ref 3.5–5.2)
Sodium: 140 mmol/L (ref 134–144)
Total Protein: 6.7 g/dL (ref 6.0–8.5)
eGFR: 99 mL/min/{1.73_m2} (ref >59)

## 2023-10-04 LAB — LIPID PANEL
Chol/HDL Ratio: 3.2 {ratio} (ref 0.0–4.4)
Cholesterol, Total: 226 mg/dL — ABNORMAL HIGH (ref 100–199)
HDL: 71 mg/dL (ref >39)
LDL Chol Calc (NIH): 119 mg/dL — ABNORMAL HIGH (ref 0–99)
Triglycerides: 208 mg/dL — ABNORMAL HIGH (ref 0–149)
VLDL Cholesterol Cal: 36 mg/dL (ref 5–40)

## 2023-10-29 ENCOUNTER — Telehealth: Payer: 59 | Admitting: Family Medicine

## 2023-10-29 DIAGNOSIS — R6889 Other general symptoms and signs: Secondary | ICD-10-CM

## 2023-10-29 MED ORDER — OSELTAMIVIR PHOSPHATE 75 MG PO CAPS: 75.0000 mg | ORAL_CAPSULE | Freq: Two times a day (BID) | ORAL | 0 refills | Status: AC

## 2023-10-29 NOTE — Progress Notes (Signed)

## 2023-11-17 ENCOUNTER — Telehealth (HOSPITAL_BASED_OUTPATIENT_CLINIC_OR_DEPARTMENT_OTHER): Payer: Self-pay | Admitting: *Deleted

## 2023-11-17 DIAGNOSIS — Z5181 Encounter for therapeutic drug level monitoring: Secondary | ICD-10-CM

## 2023-11-17 DIAGNOSIS — I10 Essential (primary) hypertension: Secondary | ICD-10-CM

## 2023-11-17 DIAGNOSIS — E782 Mixed hyperlipidemia: Secondary | ICD-10-CM

## 2023-11-17 MED ORDER — ROSUVASTATIN CALCIUM 20 MG PO TABS: 20.0000 mg | ORAL_TABLET | Freq: Every day | ORAL | 3 refills | Status: DC

## 2023-11-17 NOTE — Telephone Encounter (Signed)
-----   Message from [facility] sent at 11/16/2023  5:02 PM EST ----- Normal kidney function and electrolytes.  Triglycerides and LDL cholesterol have both improved significantly but are still elevated. Recommend increasing rosuvastatin  to 20mg  and repeating lipids/CMP in 2-3 months.

## 2023-11-17 NOTE — Telephone Encounter (Signed)
 Advised patient of lab results  Mailed lab order forms

## 2023-11-21 ENCOUNTER — Telehealth: Payer: Self-pay | Admitting: Cardiovascular Disease

## 2023-11-21 NOTE — Telephone Encounter (Signed)
 Spoke with patient regarding elevated HR, shortness of breath, and elevated blood pressures  Stated she felt very bad yesterday and called EMS EMS didn't tell her not to go to ED but would have to have another vehicle come get her, did suggest getting evaluated by either ED or her cardiologist Has copy of EKG done by EMS   Scheduled appointment with Rosaline RAMAN NP for tomorrow

## 2023-11-21 NOTE — Telephone Encounter (Signed)
 Pt sent this via Mychart to the scheduling pool:    Hi,   1. Blood pressure readings  Last 5 today:  1/14 4:03 pm 150/86 72bpm 1/14 3:59 pm 141/87 66bpm 1/14 7:22 am 139/81 63bpm 1/14 7:19 am 135/77 63bpm 1/14 7:18 am 148/80 65bpm  Yesterday when I wasn't feeling well and called 911:  1/13 9:58 am 166/91 64bpm 1/13 10:00 am 150/102 69 bpm with irregular heartbeats  1/13 10:03 am 153/104 57 bpm with irregular heartbeats 1/13 10:14 am 145/94 91 bpm with irregular heartbeats 1/13 12:32 pm 177/96 76 bpm  Then later yesterday 1/13 2:47 pm 113/75 83 bpm  When the paramedic took it was higher but was coming down.  2. I feel ok now but at the time this happened yesterday morning I was short of breath and lightheaded, feeling very unwell.  3. The BP issue was combined with high and erratic pulse, difficulty breathing and lightheadedness. The EKG that the paramedic took reads: "Abnormal ECG Unconfirmed, Sinus rhythm and Widespread ST-T abnormality may be due to myocardial ischemia".     Good Afternoon [place]  Can you tell me a little more about your blood pressure?    1. What are your last 5 BP readings?   2. Are you having any other symptoms (ex. Dizziness, headache, blurred vision, passed out)?   3. What is your BP issue?      Appointment Request From: Ronal Mac Sabot  With Provider: Annabella Scarce [facility] [address]]  Preferred Date Range: 11/20/2023 - 11/22/2023  Preferred Times: Any Time  Reason for visit: Office Visit  Health Maintenance Topic:   Comments: My heart rate suddenly increased to 150 bpm and my home blood pressure reading was very high 190/110 and I was short of breath. I called 911 and the paramedic came and bp 190/110 and did an EKG which reads Abnormal ECG "unconfirmed".  He said  that I seemed to be ok and  make an appointment with my cardiologist or could go to the hospital. I am feeling better now and would like  make an appointment

## 2023-11-22 ENCOUNTER — Encounter: Payer: 59 | Admitting: Nurse Practitioner

## 2023-11-22 ENCOUNTER — Encounter (HOSPITAL_BASED_OUTPATIENT_CLINIC_OR_DEPARTMENT_OTHER): Payer: Self-pay | Admitting: Nurse Practitioner

## 2023-11-22 ENCOUNTER — Ambulatory Visit (INDEPENDENT_AMBULATORY_CARE_PROVIDER_SITE_OTHER): Payer: 59 | Admitting: Nurse Practitioner

## 2023-11-22 VITALS — BP 122/84 | HR 75 | Ht 60.0 in | Wt 127.8 lb

## 2023-11-22 DIAGNOSIS — I10 Essential (primary) hypertension: Secondary | ICD-10-CM | POA: Diagnosis not present

## 2023-11-22 DIAGNOSIS — E782 Mixed hyperlipidemia: Secondary | ICD-10-CM

## 2023-11-22 DIAGNOSIS — R001 Bradycardia, unspecified: Secondary | ICD-10-CM

## 2023-11-22 DIAGNOSIS — G4733 Obstructive sleep apnea (adult) (pediatric): Secondary | ICD-10-CM

## 2023-11-22 DIAGNOSIS — I700 Atherosclerosis of aorta: Secondary | ICD-10-CM | POA: Diagnosis not present

## 2023-11-22 DIAGNOSIS — I471 Supraventricular tachycardia, unspecified: Secondary | ICD-10-CM | POA: Diagnosis not present

## 2023-11-22 MED ORDER — DILTIAZEM HCL 30 MG PO TABS: ORAL_TABLET | ORAL | 3 refills | Status: DC

## 2023-11-22 NOTE — Patient Instructions (Signed)
 Medication Instructions:   START Diltiazem  one (1) tablet by mouth ( 30 mg) as needed up to 3 times daily for elevated HR of 100 or above.    *If you need a refill on your cardiac medications before your next appointment, please call your pharmacy*   Lab Work:  None ordered.  If you have labs (blood work) drawn today and your tests are completely normal, you will receive your results only by: MyChart Message (if you have MyChart) OR A paper copy in the mail If you have any lab test that is abnormal or we need to change your treatment, we will call you to review the results.   Testing/Procedures:  None ordered.   Follow-Up: At [facility], you and your health needs are our priority.  As part of our continuing mission to provide you with exceptional heart care, we have created designated Provider Care Teams.  These Care Teams include your primary Cardiologist (physician) and Advanced Practice Providers (APPs -  Physician Assistants and Nurse Practitioners) who all work together to provide you with the care you need, when you need it.  We recommend signing up for the patient portal called "MyChart".  Sign up information is provided on this After Visit Summary.  MyChart is used to connect with patients for Virtual Visits (Telemedicine).  Patients are able to view lab/test results, encounter notes, upcoming appointments, etc.  Non-urgent messages can be sent to your provider as well.   To learn more about what you can do with MyChart, go to [email or web address].    Your next appointment:   1 week(s)  Provider:   Maudine Sos, MD

## 2023-11-22 NOTE — Progress Notes (Signed)
 Cardiology Office Note:  .   Date:  11/22/2023  ID:  Sarah Beasley, DOB 08-Aug-1961, MRN [id number] PCP: Benjiman Bras, MD  [facility] Providers Cardiologist:  Maudine Sos, MD    Patient Profile: .      PMH Hypertension OSA  Itamar Sleep Study 09/2023 Mild sleep apnea  Bradycardia Aortic atherosclerosis  [facility]ially established with [facility].  Diagnosed with hypertension 2015.  Myoview 2015 negative.  PCP had recently increase losartan  from 25 to 50 mg.  Family history of neuroendocrine tumors (carcinoid) and was referred to advanced hypertension clinic.  Urine 5-HIAA was normal.  Given hydralazine  as needed for spikes in BP.  Seen 07/07/2023 after being rereferred to cardiology for bradycardia.  14-day ZIO ordered due to bradycardia.  Rosuvastatin  10 mg daily started and repeat FLP/CMP recommended in 2 to 3 months.  She was referred to ENT for consideration of inspire device. Itamar sleep study also provided.   ZIO revealed predominantly normal sinus rhythm, 3 episodes of NSVT up to 19 beats at maximum rate of 231 BPM, 26 episodes of SVT lasting up to 6 beats, rare PACs/PVCs, ventricular bigeminy, no prolonged pauses requiring PPM.  Given heart rate low at times, AV nodal blocking agent deferred and recommendation to continue workup for sleep apnea. Thyroid  function and electrolytes were unremarkable.   Last cardiology clinic visit was 09/01/2023 with Neomi Banks, NP.  She reported she was feeling better since BP and HR were more stable.  Her home was undergoing renovations including her kitchen but she was still preparing healthy meals at home as much as possible.  Joined [employer] and was exercising more regularly.  Tolerating rosuvastatin  with minimal myalgia, about once per week she would skip a dose due to myalgia. She did not have any concerning cardiac symptoms and BP was well controlled; 6 mo follow-up recommended.   She contacted our  office on 11/21/2023 to report elevated BP and feeling unwell. Also SOB and lightheaded. EMS called but she was not transported to ED. Per her report, EKG taken by EMS revealed "abnormal EKG unconfirmed, sinus rhythm and widespread ST-T abnormality may be due to myocardial ischemia.         History of Present Illness: Sarah Beasley   Sarah Beasley is a very pleasant 62 y.o. female who is here today for follow-up of elevated BP and feeling "funny" and feeling her heart race while standing at the sink washing tomatoes on 11/20/2023. She felt lightheaded and like her heart was "skipping around."  She reports no exacerbating factors to her awareness, although she has recently resumed drinking caffeinated coffee rather than decaf. She noted HR of 150 bpm on her Fitbit.  She called 911 due to the severity of the symptoms and was later evaluated by EMS but states it was several minutes after she initially started feeling poorly.  EMS EKG revealed sinus rhythm at 88 bpm, widespread ST-T abnormality. States she has not had an episode as severe as this in the past. She has been monitoring at home with FitBit and Apple Watch and has some EKG tracings from her Apple Watch. She lives alone and is concerned about her passing out. She has not experienced syncope but felt like she would have passed out if she had not gotten medical attention on 1/13.  Was eating better using Noom and had achieved 10 lb weight loss prior to the holidays. Admits to dietary indiscretion and drinking more wine recently.  She  denies chest pain, shortness of breath, orthopnea, PND, edema. Went for a walk around [employer] and did fine during the walk but was exhausted afterwards.   Discussed the use of AI scribe software for clinical note transcription with the patient, who gave verbal consent to proceed.   ROS: See HPI       Studies Reviewed: Sarah Beasley   EKG Interpretation Date/Time:  Wednesday November 22 2023 15:35:59 EST Ventricular Rate:   75 PR Interval:  124 QRS Duration:  86 QT Interval:  384 QTC Calculation: 428 R Axis:   -1  Text Interpretation: Normal sinus rhythm Minimal voltage criteria for LVH, may be normal variant ( Cornell product ) Nonspecific ST and T wave abnormality Confirmed by Slater Duncan [phone]) on 11/22/2023 3:54:06 PM     Risk Assessment/Calculations:             Physical Exam:   VS:  BP 122/84 (BP Location: Left Arm, Patient Position: Sitting, Cuff Size: Normal)   Pulse 75   Ht 5' (1.524 m)   Wt 127 lb 12.8 oz (58 kg)   LMP 03/29/2021   SpO2 97%   BMI 24.96 kg/m    Wt Readings from Last 3 Encounters:  11/22/23 127 lb 12.8 oz (58 kg)  09/01/23 131 lb (59.4 kg)  07/07/23 137 lb 6.4 oz (62.3 kg)    GEN: Well nourished, well developed in no acute distress NECK: No JVD; No carotid bruits CARDIAC: RRR, no murmurs, rubs, gallops RESPIRATORY:  Clear to auscultation without rales, wheezing or rhonchi  ABDOMEN: Soft, non-tender, non-distended EXTREMITIES:  No edema; No deformity     ASSESSMENT AND PLAN: .    SVT: Episode of symptomatic tachycardia on 11/20/2023.  EMS was called but by the time they arrived HR was down to 88 bpm.  Question of possible ST abnormality on EKG personally reviewed by me and Dr. Veryl Gottron with no acute concerns. EKG today reveals nonspecific ST abnormality as well.  EKG tracings from Apple Watch reviewed, no significant abnormality, occasional PACs/PVCs. HR is well controlled today. Discussed potentially repeating external cardiac monitor, however with infrequency of symptoms it may not be conclusive. History of bradycardia limits medical therapy with AV nodal blocking agents. Reviewed plan with Dr. Veryl Gottron, DOD, who recommends that she notify us  if symptoms recur at which time we consider referral to EP for ILR.  We will give her low-dose diltiazem  to take for HR > 100 bpm. ER precautions advised. She has soon appointment with Dr. Theodis Fiscal, advised her to call with  questions or concerns prior to next appointment.   Hypertension: BP was elevated during event on 11/20/23, however it is well controlled today. She reports BP has been well controlled at home with the exception of 11/20/23.  She has not taken as needed hydralazine .  We will continue losartan  50 mg daily.  OSA: Itamar sleep study completed 09/20/23 revealed mild sleep apnea. Sleep team has reached out to her to schedule an appointment. Not specifically addressed today.   Hyperlipidemia LDL goal < 70/Aortic atherosclerosis: Lipid panel completed 10/03/2023 revealed total cholesterol 226, triglycerides 208, HDL 71, and LDL 119.  She was advised to increase rosuvastatin  to 20 mg daily and has orders for repeat testing after 2-3 months of therapy. She denies chest pain, dyspnea, or other symptoms concerning for angina.  No indication for further ischemic evaluation at this time.        Dispo: Keep your appointment next week with Dr. Theodis Fiscal  Signed, Slater Duncan, NP-C

## 2023-12-01 ENCOUNTER — Ambulatory Visit (INDEPENDENT_AMBULATORY_CARE_PROVIDER_SITE_OTHER): Payer: 59 | Admitting: Cardiovascular Disease

## 2023-12-01 ENCOUNTER — Encounter (HOSPITAL_BASED_OUTPATIENT_CLINIC_OR_DEPARTMENT_OTHER): Payer: Self-pay | Admitting: Cardiovascular Disease

## 2023-12-01 VITALS — BP 126/78 | HR 64 | Ht 60.0 in | Wt 126.4 lb

## 2023-12-01 DIAGNOSIS — E7801 Familial hypercholesterolemia: Secondary | ICD-10-CM

## 2023-12-01 DIAGNOSIS — G4733 Obstructive sleep apnea (adult) (pediatric): Secondary | ICD-10-CM

## 2023-12-01 DIAGNOSIS — I471 Supraventricular tachycardia, unspecified: Secondary | ICD-10-CM | POA: Insufficient documentation

## 2023-12-01 DIAGNOSIS — R0609 Other forms of dyspnea: Secondary | ICD-10-CM | POA: Diagnosis not present

## 2023-12-01 DIAGNOSIS — R001 Bradycardia, unspecified: Secondary | ICD-10-CM

## 2023-12-01 DIAGNOSIS — I10 Essential (primary) hypertension: Secondary | ICD-10-CM

## 2023-12-01 DIAGNOSIS — I472 Ventricular tachycardia, unspecified: Secondary | ICD-10-CM

## 2023-12-01 DIAGNOSIS — Z01812 Encounter for preprocedural laboratory examination: Secondary | ICD-10-CM | POA: Diagnosis not present

## 2023-12-01 HISTORY — DX: Other forms of dyspnea: R06.09

## 2023-12-01 HISTORY — DX: Supraventricular tachycardia, unspecified: I47.10

## 2023-12-01 MED ORDER — METOPROLOL TARTRATE 25 MG PO TABS
ORAL_TABLET | ORAL | 0 refills | Status: DC
Start: 2023-12-01 — End: 2024-01-03

## 2023-12-01 NOTE — Patient Instructions (Addendum)
Medication Instructions:  TAKE METOPROLOL 25 MG 1 TABLET 2 HOURS PRIOR TO CARDIAC CT  *If you need a refill on your cardiac medications before your next appointment, please call your pharmacy*  Lab Work: BMET 1 WEEK PRIOR TO CT   FASTING LP/CMET FIRST OF APRIL   If you have labs (blood work) drawn today and your tests are completely normal, you will receive your results only by: MyChart Message (if you have MyChart) OR A paper copy in the mail If you have any lab test that is abnormal or we need to change your treatment, we will call you to review the results.  Testing/Procedures: Your physician has requested that you have cardiac CT. Cardiac computed tomography (CT) is a painless test that uses an x-ray machine to take clear, detailed pictures of your heart. For further information please visit [email or web address] Please follow instruction sheet as given. ONCE INSURANCE HAS BEEN REVIEWED THE OFFICE WILL CALL YOU TO SCHEDULE. IF YOU DO NOT HEAR IN 2 WEEKS CALL TO FOLLOW UP   Follow-Up: At [facility], you and your health needs are our priority.  As part of our continuing mission to provide you with exceptional heart care, we have created designated Provider Care Teams.  These Care Teams include your primary Cardiologist (physician) and Advanced Practice Providers (APPs -  Physician Assistants and Nurse Practitioners) who all work together to provide you with the care you need, when you need it.  We recommend signing up for the patient portal called "MyChart".  Sign up information is provided on this After Visit Summary.  MyChart is used to connect with patients for Virtual Visits (Telemedicine).  Patients are able to view lab/test results, encounter notes, upcoming appointments, etc.  Non-urgent messages can be sent to your provider as well.   To learn more about what you can do with MyChart, go to [email or web address].    Your next appointment:   3 month(s)  Provider:    Chilton Si, MD or Gillian Shields, NP    You have been referred to ELECTROPHYSIOLOGY   IF YOU DO NOT HEAR IN 2 WEEKS CALL TO FOLLOW UP  Other Instructions   Your cardiac CT will be scheduled at one of the below locations:   [facility] [address] [place], [place] [address] [phone]  OR  [facility] [address] [place], [place] [address] [phone]  OR   [facility] [address] [place], [place] [address] [phone]  OR   [facility] [address] [place], [place] [address] [phone]  If scheduled at [facility], please arrive at the [facility] and Children's Entrance (Entrance C2) of [facility] 30 minutes prior to test start time. You can use the FREE valet parking offered at entrance C (encouraged to control the heart rate for the test)  Proceed to the [facility] Radiology Department (first floor) to check-in and test prep.  All radiology patients and guests should use entrance C2 at [facility], accessed from [employer], even though the hospital's physical address listed is [address].    If scheduled at [facility] or [facility], please arrive 15 mins early for check-in and test prep.  There is spacious parking and easy access to the radiology department from the [facility] entrance. Please enter here and check-in with the desk attendant.  Please follow these instructions carefully (unless otherwise directed):  An IV will be required for this test and Nitroglycerin will be given.  Hold all erectile dysfunction medications at least 3 days (72 hrs) prior to test. (Ie viagra, cialis, sildenafil, tadalafil, etc)   On the Night Before the Test: Be sure to Drink plenty of water. Do not consume any caffeinated/decaffeinated beverages or  chocolate 12 hours prior to your test. Do not take any antihistamines 12 hours prior to your test.  On the Day of the Test: Drink plenty of water until 1 hour prior to the test. Do not eat any food 1 hour prior to test. You may take your regular medications prior to the test.  Take metoprolol (Lopressor) two hours prior to test. If you take Furosemide/Hydrochlorothiazide/Spironolactone/Chlorthalidone, please HOLD on the morning of the test. Patients who wear a continuous glucose monitor MUST remove the device prior to scanning. FEMALES- please wear underwire-free bra if available, avoid dresses & tight clothing       After the Test: Drink plenty of water. After receiving IV contrast, you may experience a mild flushed feeling. This is normal. On occasion, you may experience a mild rash up to 24 hours after the test. This is not dangerous. If this occurs, you can take Benadryl 25 mg and increase your fluid intake. If you experience trouble breathing, this can be serious. If it is severe call 911 IMMEDIATELY. If it is mild, please call our office.  We will call to schedule your test 2-4 weeks out understanding that some insurance companies will need an authorization prior to the service being performed.   For more information and frequently asked questions, please visit our website : [email or web address]  For non-scheduling related questions, please contact the cardiac imaging nurse navigator should you have any questions/concerns: Cardiac Imaging Nurse Navigators Direct Office Dial: [phone]   For scheduling needs, including cancellations and rescheduling, please call [place], [phone].  Cardiac CT Angiogram A cardiac CT angiogram is a procedure to look at the heart and the area around the heart. It may be done to help find the cause of chest pains or other symptoms of heart disease. During this procedure, a substance called contrast dye is injected into a vein in  the arm. The contrast highlights the blood vessels in the area to be checked. A large X-ray machine (CT scanner), then takes detailed pictures of the heart and the surrounding area. The procedure is also sometimes called a coronary CT angiogram, coronary artery scanning, or CTA. A cardiac CT angiogram allows the health care provider to see how well blood is flowing to and from the heart. The provider will be able to see if there are any problems, such as: Blockage or narrowing of the arteries in the heart. Fluid around the heart. Signs of weakness or disease in the muscles, valves, and tissues of the heart. Tell a health care provider about: Any allergies you have. This is especially important if you have had a previous allergic reaction to medicines, contrast dye, or iodine. All medicines you are taking, including vitamins, herbs, eye drops, creams, and over-the-counter medicines. Any bleeding problems you have. Any surgeries you have had. Any medical conditions you have, including kidney problems or kidney failure. Whether you are pregnant or may be pregnant. Any anxiety disorders, chronic pain, or other conditions you have. These may increase your stress or prevent you from lying still. Any history of abnormal heart rhythms or heart procedures. What are the risks?  Your provider will talk with you about risks. These may include: Bleeding. Infection. Allergic reactions to medicines or dyes. Damage to other structures or organs. Kidney damage from the contrast dye. Increased risk of cancer from radiation exposure. This risk is low. Talk with your provider about: The risks and benefits of testing. How you can receive the lowest dose of radiation. What happens before the procedure? Wear comfortable clothing and remove any jewelry, glasses, dentures, and hearing aids. Follow instructions from your provider about eating and drinking. These may include: 12 hours before the procedure Avoid  caffeine. This includes tea, coffee, soda, energy drinks, and diet pills. Drink plenty of water or other fluids that do not have caffeine in them. Being well hydrated can prevent complications. 4-6 hours before the procedure Stop eating and drinking. This will reduce the risk of nausea from the contrast dye. Ask your provider about changing or stopping your regular medicines. These include: Diabetes medicines. Medicines to treat problems with erections (erectile dysfunction). If you have kidney problems, you may need to receive IV hydration before and after the test. What happens during the procedure?  Hair on your chest may need to be removed so that small sticky patches called electrodes can be placed on your chest. These will transmit information that helps to monitor your heart during the procedure. An IV will be inserted into one of your veins. You might be given a medicine to control your heart rate during the procedure. This will help to ensure that good images are obtained. You will be asked to lie on an exam table. This table will slide in and out of the CT machine during the procedure. Contrast dye will be injected into the IV. You might feel warm, or you may get a metallic taste in your mouth. You may be given medicines to relax or dilate the arteries in your heart. If you are allergic to contrast dyes or iodine you may be given medicine before the test to reduce the risk of an allergic reaction. The table that you are lying on will move into the CT machine tunnel for the scan. The person running the machine will give you instructions while the scans are being done. You may be asked to: Keep your arms above your head. Hold your breath for short periods. Stay very still, even if the table is moving. The procedure may vary among providers and hospitals. What can I expect after the procedure? After your procedure, it is common to have: A metallic taste in your mouth from the contrast  dye. A feeling of warmth. A headache from the heart medicine. Follow these instructions at home: Take over-the-counter and prescription medicines only as told by your provider. If you are told, drink enough fluid to keep your pee pale yellow. This will help to flush the contrast dye out of your body. Most people can return to their normal activities right after the procedure. Ask your provider what activities are safe for you. It is up to you to get the results of your procedure. Ask your provider, or the department that is doing the procedure, when your results will be ready. Contact a health care provider if: You have any symptoms of allergy to the contrast dye. These include: Shortness of breath. Rash or hives. A racing heartbeat. You notice a change in your peeing (urination). This information is not intended to replace advice given to you by your health care provider. Make sure you discuss any questions you have  with your health care provider. Document Revised: 05/27/2022 Document Reviewed: 05/27/2022 Elsevier Patient Education  2024 [employer].

## 2023-12-01 NOTE — Progress Notes (Signed)
Cardiology Office Note:  .   Date:  12/01/2023  ID:  Sarah Beasley, DOB [birth year], MRN [id number] PCP: Shade Flood, MD  [facility] Providers Cardiologist:  Chilton Si, MD    History of Present Illness: Sarah Beasley    Sarah Beasley is a 63 y.o. female with a hx of hypertension and OSA  not on CPAP here for follow-up. She established care in the hypertension clinic 02/01/2021. She was first diagnosed with hypertension in 2015. She had a nuclear stress test (2015) that was negative. In general her blood pressure has been well-controlled.  When she moved from [place] she stopped taking her medicine and initially her blood pressure was stable.  She has been working with Dr. Chilton Si and losartan was increased from 25 mg to 50 mg to 100 mg without adequate control.  She has a family history of neuroendocrine tumors (carcinoid), therefore referred to advanced hypertension clinic for evaluation of secondary causes.    Sarah Beasley reported a couple episodes of spiking blood pressures up to the 200s, causing lightheadedness and weakness. However, her blood pressure was more controlled at home. She was also waiting on a CPAP. At her initial visit her blood pressure was 128/76. Urine 5-HIAA was within normal limits. She was given hydralazine as needed for spikes in her blood pressure.  She was last seen 07/2021 and BP was mostly improved.  She saw her PCP 07/03/23 and was ntoed to be bradycardic so she was referred to cardiology.   Sarah Beasley reports intermittent episodes of a sudden drop in heart rate to around 40 bpm, as recorded by her Fitbit. These episodes are brief, lasting only a few seconds, and are associated with a sensation of a 'hitch,' causing the patient to pause. The patient reports feeling lightheaded during one of these episodes, similar to previous experiences of fainting, and even saw a bright light. This episode occurred while driving, but the patient was able to pull  over safely.  She wore a 14 day Zio monitor that showed 3 episodes of NSVT up to 19 beats at a rate of 231 bpm.  26 SVT episodes lasting up to 6 beats.  Given her bradycardia she was encouraged to treat OSA rather than start nodal agents.  She started rosuvastatin.  Ms. Brunet called EMS 11/2023 for shortness of breath and lightheadedness.  BP was also elevated.  EMS reportedly saw an abnormal EKG.   Sarah Beasley presented with an episode of sudden onset palpitations while at home. She reported feeling "off" and noted a heart rate of 152 beats per minute on her Fitbit. The patient described feeling unwell, short of breath, and experiencing a sensation of her heart beating abnormally. She also reported lightheadedness and a fear of passing out.  Her home blood pressure monitor showed high readings and multiple skipped beats. The symptoms worsened over time, prompting the patient to call emergency services.  In the days following the episode, the patient reported feeling unwell and experienced additional skipped beats. She also noted episodes of shortness of breath, particularly in the mornings, which required her to stop and catch her breath. The patient denied any associated chest pain or pressure during these episodes.  She reported a decrease in her usual exercise routine due to recent illness over the Christmas period, which presented with fever and flu-like symptoms. She tested negative for both COVID-19 and influenza. The patient reported feeling exhausted after a half-mile walk the day after the initial episode.  She also noted occasional low blood pressure readings, but generally described her blood pressure as normal. The patient denied any leg swelling.  She has been on a higher dose of atorvastatin for a few weeks and reported no adverse effects. She also reported a history of leg pain with a lower dose of the medication. The patient is due for a follow-up lipid panel and comprehensive metabolic  panel in the coming months.      ROS:  As per HPI  Studies Reviewed: .       14 Day Zio Monitor 07/2023:   Quality: Fair.  Baseline artifact. Predominant rhythm: sinus rhythm Average heart rate: 67 bpm Max heart rate: 136 bpm Min heart rate: 38 bpm Pauses >2.5 seconds: none     3 episodes of NSVT lasting up to 19 beats at a maximum rate of 231 bpm. 26 episodes of SVT lasting up to 6 beats Rare (<1%) PACs and PVCs Rare ventricular bigeminy  Risk Assessment/Calculations:             Physical Exam:   VS:  BP 126/78   Pulse 64   Ht 5' (1.524 m)   Wt 126 lb 6.4 oz (57.3 kg)   LMP 03/29/2021   SpO2 98%   BMI 24.69 kg/m  , BMI Body mass index is 24.69 kg/m. GENERAL:  Well appearing HEENT: Pupils equal round and reactive, fundi not visualized, oral mucosa unremarkable NECK:  No jugular venous distention, waveform within normal limits, carotid upstroke brisk and symmetric, no bruits, no thyromegaly LUNGS:  Clear to auscultation bilaterally HEART:  RRR.  PMI not displaced or sustained,S1 and S2 within normal limits, no S3, no S4, no clicks, no rubs, no murmurs ABD:  Flat, positive bowel sounds normal in frequency in pitch, no bruits, no rebound, no guarding, no midline pulsatile mass, no hepatomegaly, no splenomegaly EXT:  2 plus pulses throughout, no edema, no cyanosis no clubbing SKIN:  No rashes no nodules NEURO:  Cranial nerves II through XII grossly intact, motor grossly intact throughout PSYCH:  Cognitively intact, oriented to person place and time   ASSESSMENT AND PLAN: .    # Palpitations and Tachycardia Recent episode of palpitations with heart rate of 152 bpm, associated with shortness of breath and lightheadedness. EKG showed nonspecific findings. Patient has intermittent episodes of feeling short of breath, particularly in the mornings.  Ambulatory monitor showed both SVT and NSVT but also bradycardia, limiting options for treatment with nodal agents.  -Refer to  electrophysiology for further evaluation and consideration of an implantable monitor.  # Hypertension Blood pressure readings have been variable, with some readings being high and others low.  Controlled in the office today. -Continue losartan and monitor blood pressure.  #Coronary Artery Disease Risk # Hyperlipidemia:  Inferior T wave inversions noted on EKG, which may suggest ischemia. Patient has been on a higher dose of atorvastatin for a few months. -Order Cardiac CT to evaluate coronary arteries. -Check lipid panel and comprehensive metabolic panel in April 2025 to assess response to atorvastatin.  Follow-up Plan to follow up in three months after completion of the above evaluations and tests.       Signed, Chilton Si, MD

## 2023-12-07 ENCOUNTER — Telehealth (HOSPITAL_BASED_OUTPATIENT_CLINIC_OR_DEPARTMENT_OTHER): Payer: Self-pay | Admitting: Cardiovascular Disease

## 2023-12-07 LAB — BASIC METABOLIC PANEL
BUN/Creatinine Ratio: 22 (ref 12–28)
BUN: 15 mg/dL (ref 8–27)
CO2: 21 mmol/L (ref 20–29)
Calcium: 10 mg/dL (ref 8.7–10.3)
Chloride: 103 mmol/L (ref 96–106)
Creatinine, Ser: 0.67 mg/dL (ref 0.57–1.00)
Glucose: 82 mg/dL (ref 70–99)
Potassium: 4.3 mmol/L (ref 3.5–5.2)
Sodium: 139 mmol/L (ref 134–144)
eGFR: 99 mL/min/{1.73_m2} (ref >59)

## 2023-12-07 NOTE — Telephone Encounter (Signed)
Patient stopped by the front desk to ask if her dental implants would be an issue for her cardiac CT morph scan on Wed 2/5. Reached out to [place] who confirmed that pt's dental implants won't be an issue. Left VM for patient letting her know.

## 2023-12-11 ENCOUNTER — Encounter (HOSPITAL_COMMUNITY): Payer: Self-pay

## 2023-12-13 ENCOUNTER — Ambulatory Visit (HOSPITAL_COMMUNITY)
Admission: RE | Admit: 2023-12-13 | Discharge: 2023-12-13 | Disposition: A | Discharge status: Home / Self Care | Payer: 59 | Source: Ambulatory Visit | Attending: Cardiovascular Disease | Admitting: Cardiovascular Disease

## 2023-12-13 DIAGNOSIS — R0609 Other forms of dyspnea: Secondary | ICD-10-CM

## 2023-12-13 MED ORDER — NITROGLYCERIN 0.4 MG SL SUBL
SUBLINGUAL_TABLET | SUBLINGUAL | Status: AC
  Filled 2023-12-13: qty 2

## 2023-12-13 MED ORDER — NITROGLYCERIN 0.4 MG SL SUBL
0.8000 mg | SUBLINGUAL_TABLET | Freq: Once | SUBLINGUAL | Status: AC
Start: 2023-12-13 — End: 2023-12-13
  Administered 2023-12-13: 0.8 mg via SUBLINGUAL

## 2023-12-13 MED ORDER — IOHEXOL 350 MG/ML SOLN
95.0000 mL | Freq: Once | INTRAVENOUS | Status: AC | PRN
  Administered 2023-12-13: 95 mL via INTRAVENOUS

## 2023-12-24 ENCOUNTER — Encounter (HOSPITAL_BASED_OUTPATIENT_CLINIC_OR_DEPARTMENT_OTHER): Payer: Self-pay

## 2024-01-03 ENCOUNTER — Encounter: Payer: Self-pay | Admitting: Family Medicine

## 2024-01-03 ENCOUNTER — Ambulatory Visit (INDEPENDENT_AMBULATORY_CARE_PROVIDER_SITE_OTHER): Payer: 59 | Admitting: Family Medicine

## 2024-01-03 VITALS — BP 122/72 | HR 58 | Temp 98.1°F | Ht 60.0 in | Wt 126.0 lb

## 2024-01-03 DIAGNOSIS — E782 Mixed hyperlipidemia: Secondary | ICD-10-CM | POA: Diagnosis not present

## 2024-01-03 DIAGNOSIS — I10 Essential (primary) hypertension: Secondary | ICD-10-CM | POA: Diagnosis not present

## 2024-01-03 DIAGNOSIS — G4733 Obstructive sleep apnea (adult) (pediatric): Secondary | ICD-10-CM | POA: Diagnosis not present

## 2024-01-03 NOTE — Progress Notes (Signed)
 Subjective:  Patient ID: Sarah Beasley, female    DOB: [birth year]  Age: 63 y.o. MRN: [id number]  CC:  Chief Complaint  Patient presents with   Medical Management of Chronic Issues    Pt states no concerns     HPI Sarah Beasley presents for   Hypertension: Followed by cardiology, Dr. Duke Salvia with appointment January 24.  Prior ZIO monitor with 3 episodes of NSVT up to 19 beats at a rate of 231, 26 SVT episodes.  Given bradycardia history was encouraged to treat OSA rather than start nodal agents.  She had been seen by EMS in January for shortness of breath and lightheadedness, elevated blood pressure at that time.  Nonspecific EKG findings at January 24 visit.  Was referred to electrophysiology for possible consideration of implantable monitor.  Continued on losartan and home monitoring for blood pressure without changes given variable readings.  Inferior T wave inversions were noted on EKG, cardiac CT ordered to evaluate coronary arteries.  Plan for repeat labs for lipids in April.  20-month follow-up with cardiology.  Coronary calcium score of 0 on 2/5.  Appointment with Dr. Nelly Laurence on March 3. Could not tolerate CPAP, repeat test reportedly mild. Option of Inspire? Referred to ENT to discuss. Called 6 months ago -no appt? Sleep is better.   Still taking losartan 50mg  every day, no recent use of diltiazem. Had not used with prior lows.  No current CP/palpitations/dyspnea.   Home readings: 120/70 range. 98/67 , 117/79.  Drinking fluids.  BP Readings from Last 3 Encounters:  01/03/24 122/72  12/13/23 134/72  12/01/23 126/78   Lab Results  Component Value Date   CREATININE 0.67 12/07/2023   Hyperlipidemia: Now on Crestor 20 mg daily since early January. Planned repeat lipids in April.  Lab Results  Component Value Date   CHOL 226 (H) 10/03/2023   HDL 71 10/03/2023   LDLCALC 119 (H) 10/03/2023   LDLDIRECT 195.0 07/03/2023   TRIG 208 (H) 10/03/2023   CHOLHDL 3.2  10/03/2023   Lab Results  Component Value Date   ALT 16 10/03/2023   AST 19 10/03/2023   ALKPHOS 66 10/03/2023   BILITOT 0.5 10/03/2023    HM: Followed by GYN - pap testing in December. [facility].    History Patient Active Problem List   Diagnosis Date Noted   Exertional dyspnea 12/01/2023   SVT (supraventricular tachycardia) (HCC) 12/01/2023   Familial hypercholesterolemia 07/07/2023   Bradycardia 08/04/2021   Essential hypertension 02/01/2021   OSA (obstructive sleep apnea) 02/01/2021   Heart murmur 10/08/2020   Anemia 10/08/2020   Hematuria 07/07/2017   Past Medical History:  Diagnosis Date   Anemia 10/08/2020   Bradycardia 08/04/2021   Essential hypertension 02/01/2021   Exertional dyspnea 12/01/2023   Familial hypercholesterolemia 07/07/2023   Heart murmur    past history   Hypertension    OSA (obstructive sleep apnea) 02/01/2021   Sleep apnea    does not wear cpap as of yet. backordered   SVT (supraventricular tachycardia) (HCC) 12/01/2023   Past Surgical History:  Procedure Laterality Date   KNEE SURGERY Left    MOUTH SURGERY     No Known Allergies Prior to Admission medications   Medication Sig Start Date End Date Taking? Authorizing Provider  augmented betamethasone dipropionate (DIPROLENE-AF) 0.05 % cream Apply 1 Application topically daily as needed. 06/27/23  Yes [provider]  diltiazem (CARDIZEM) 30 MG tablet Take one (1) tablet ( 30 mg) by mouth  as needed for elevated HR over 100 up to 3 times daily. 11/22/23  Yes Swinyer, Zachary George, NP  Ibuprofen (ADVIL PO) Take by mouth.   Yes [provider]  losartan (COZAAR) 50 MG tablet Take 1 tablet (50 mg total) by mouth daily. 07/03/23  Yes Shade Flood, MD  metoprolol tartrate (LOPRESSOR) 25 MG tablet Take 1 tablet (25 mg) TWO hours prior to CT scan 12/01/23  Yes Chilton Si, MD  Multiple Vitamin (MULTIVITAMIN) tablet Take 1 tablet by mouth daily.   Yes [provider]  rosuvastatin (CRESTOR) 20 MG tablet Take 1 tablet (20 mg total) by mouth daily. 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    Social Drivers of Corporate investment banker Strain: Low Risk  (12/31/2023)   Overall Financial Resource Strain (CARDIA)    Difficulty of Paying Living Expenses: Not hard at all  Food Insecurity: No Food Insecurity (12/31/2023)   Hunger Vital Sign    Worried About Running Out of Food in the Last Year: Never true    Ran Out of Food in the Last Year: Never true  Transportation Needs: No Transportation Needs (12/31/2023)   PRAPARE - Administrator, Civil Service (Medical): No    Lack of Transportation (Non-Medical): No  Physical Activity: Sufficiently Active (12/31/2023)   Exercise Vital Sign    Days of Exercise per Week: 4 days    Minutes of Exercise per Session: 60 min  Stress: No Stress Concern Present (12/31/2023)   [employer] of Occupational Health - Occupational Stress Questionnaire    Feeling of Stress : Only  a little  Social Connections: Moderately Isolated (12/31/2023)   Social Connection and Isolation Panel [NHANES]    Frequency of Communication with Friends and Family: More than three times a week    Frequency of Social Gatherings with Friends and Family: Once a week    Attends Religious Services: More than 4 times per year    Active Member of [employer] or Organizations: No    Attends Engineer, structural: Not on file    Marital Status: Never married  Intimate Partner Violence: Not on file    Review of Systems Per HPI.   Objective:   Vitals:   01/03/24 0818  BP: 122/72  Pulse: (!) 58  Temp: 98.1 F (36.7 C)  TempSrc:  Temporal  SpO2: 97%  Weight: 126 lb (57.2 kg)  Height: 5' (1.524 m)     Physical Exam Vitals reviewed.  Constitutional:      Appearance: Normal appearance. She is well-developed.  HENT:     Head: Normocephalic and atraumatic.  Eyes:     Conjunctiva/sclera: Conjunctivae normal.     Pupils: Pupils are equal, round, and reactive to light.  Neck:     Vascular: No carotid bruit.  Cardiovascular:     Rate and Rhythm: Normal rate and regular rhythm.     Heart sounds: Normal heart sounds.  Pulmonary:     Effort: Pulmonary effort is normal.     Breath sounds: Normal breath sounds.  Abdominal:     Palpations: Abdomen is soft. There is no pulsatile mass.     Tenderness: There is no abdominal tenderness.  Musculoskeletal:     Right lower leg: No edema.     Left lower leg: No edema.  Skin:    General: Skin is warm and dry.  Neurological:     Mental Status: She is alert and oriented to person, place, and time.  Psychiatric:        Mood and Affect: Mood normal.        Behavior: Behavior normal.        Assessment & Plan:  Sarah Beasley is a 63 y.o. female . Mixed hyperlipidemia  -Tolerating higher dose of Crestor, plans on labs through cardiology.  Deferred lab work today.  No changes  Essential hypertension  -With variable readings,  possible underlying arrhythmia, follow-up planned with Dr. Nelly Laurence with electrophysiology soon.  No med changes at this time.  Reassuring coronary calcium scoring.  RTC/ER precautions.  OSA (obstructive sleep apnea)  -Did not tolerate CPAP.  Had been referred to ENT to consider inspire device but lost to follow-up.  Asked that she call the ENT to schedule appointment, new referral can be placed if needed.  No orders of the defined types were placed in this encounter.  Patient Instructions  Call ENT to schedule appointment to discuss Inspire for sleep apnea.  No med changes at this time. Keep follow up with Dr. Nelly Laurence as planned.  Return to the clinic or go to the nearest emergency room if any of your symptoms worsen or new symptoms occur. Take care!    Signed,   Meredith Staggers, MD [facility], [facility] 01/03/24 8:57 AM

## 2024-01-03 NOTE — Patient Instructions (Addendum)
 Call ENT to schedule appointment to discuss Inspire for sleep apnea.  No med changes at this time. Keep follow up with Dr. Nelly Laurence as planned.  Return to the clinic or go to the nearest emergency room if any of your symptoms worsen or new symptoms occur. Take care!

## 2024-01-08 ENCOUNTER — Encounter: Payer: Self-pay | Admitting: Cardiovascular Disease

## 2024-01-08 ENCOUNTER — Ambulatory Visit: Payer: 59 | Attending: Cardiovascular Disease | Admitting: Cardiovascular Disease

## 2024-01-08 VITALS — BP 114/70 | HR 70 | Ht 60.0 in | Wt 127.6 lb

## 2024-01-08 DIAGNOSIS — R001 Bradycardia, unspecified: Secondary | ICD-10-CM | POA: Diagnosis not present

## 2024-01-08 DIAGNOSIS — I472 Ventricular tachycardia, unspecified: Secondary | ICD-10-CM

## 2024-01-08 DIAGNOSIS — I471 Supraventricular tachycardia, unspecified: Secondary | ICD-10-CM | POA: Diagnosis not present

## 2024-01-08 MED ORDER — LOSARTAN POTASSIUM 25 MG PO TABS: 25.0000 mg | ORAL_TABLET | Freq: Every day | ORAL | 3 refills | Status: AC

## 2024-01-08 MED ORDER — PROPRANOLOL HCL ER 60 MG PO CP24: 60.0000 mg | ORAL_CAPSULE | Freq: Every day | ORAL | 3 refills | Status: DC

## 2024-01-08 NOTE — Patient Instructions (Signed)
 Medication Instructions:  DECREASE Losartan to 25 mg once daily  START Propranolol ER 60 mg once daily  *If you need a refill on your cardiac medications before your next appointment, please call your pharmacy*   Testing/Procedures: Echocardiogram Your physician has requested that you have an echocardiogram. Echocardiography is a painless test that uses sound waves to create images of your heart. It provides your doctor with information about the size and shape of your heart and how well your heart's chambers and valves are working. This procedure takes approximately one hour. There are no restrictions for this procedure. Please do NOT wear cologne, perfume, aftershave, or lotions (deodorant is allowed). Please arrive 15 minutes prior to your appointment time.  Please note: We ask at that you not bring children with you during ultrasound (echo/ vascular) testing. Due to room size and safety concerns, children are not allowed in the ultrasound rooms during exams. Our front office staff cannot provide observation of children in our lobby area while testing is being conducted. An adult accompanying a patient to their appointment will only be allowed in the ultrasound room at the discretion of the ultrasound technician under special circumstances. We apologize for any inconvenience.   Follow-Up: At [facility], you and your health needs are our priority.  As part of our continuing mission to provide you with exceptional heart care, we have created designated Provider Care Teams.  These Care Teams include your primary Cardiologist (physician) and Advanced Practice Providers (APPs -  Physician Assistants and Nurse Practitioners) who all work together to provide you with the care you need, when you need it.  We recommend signing up for the patient portal called "MyChart".  Sign up information is provided on this After Visit Summary.  MyChart is used to connect with patients for Virtual Visits  (Telemedicine).  Patients are able to view lab/test results, encounter notes, upcoming appointments, etc.  Non-urgent messages can be sent to your provider as well.   To learn more about what you can do with MyChart, go to [email or web address].    Your next appointment:   6 month(s)  Provider:   York Pellant, MD

## 2024-01-08 NOTE — Progress Notes (Signed)
 Electrophysiology Office Note:    Date:  01/08/2024   ID:  Sarah Beasley, DOB 1961/07/15, MRN [id number]  PCP:  Shade Flood, MD   [facility] Providers Cardiologist:  Chilton Si, MD     Referring MD: Chilton Si, MD   History of Present Illness:    Sarah Beasley is a 63 y.o. female with a medical history significant for hypertension, OSA on CPAP, referred for palpitations --PACs, atrial runs, PVCs.     I discussed the use of AI scribe software for clinical note transcription with the patient, who gave verbal consent to proceed.  Sarah Beasley is a 63 year old female with hypertension who presents with palpitations and irregular heartbeats. She was referred by Dr. Duke Salvia from the hypertension clinic for evaluation of palpitations.  In January, she experienced a significant episode of palpitations and irregular heartbeats. She felt fine in the morning but suddenly felt 'a little funny' while standing at the sink. Her Fitbit recorded a pulse of 150 bpm, which was unusually high for her. She experienced shortness of breath and elevated blood pressure despite having taken her medication. The episode lasted approximately 10 minutes, during which she felt progressively worse, with skipped beats and a sensation of possibly passing out, prompting her to call 911. An EMT performed an EKG which was abnormal -- I personally reviewed the EKG; it showed normal sinus rhythm -- but she opted to follow up with her doctor instead of going to the hospital. Her heart rate had decreased to 80-90 bpm by the time the EMT left. The palpitations and skipped beats continued into the afternoon and the next morning, with her blood pressure remaining high for a few days.  She has been diagnosed with premature ventricular contractions (PVCs) and premature atrial contractions (PACs), which sometimes occur in clusters. She has not identified any specific triggers for these  episodes, such as caffeine or stress, and has been conscientious about her diet since being diagnosed with high cholesterol and starting a statin. She lost about 10 pounds but noted a lapse in discipline over the holidays.  She has been prescribed diltiazem to use if another episode occurs, but she has not needed to take it. She feels good when vigilant about her health, though she occasionally experiences shortness of breath and palpitations, such as in the waiting room before the appointment. She has a history of anxiety but manages it well and does not let herself become anxious.      Today, she reports that she is 9.  She did have some palpitations while in the waiting room.  She has not had syncope, presyncope, dizziness, lightheadedness recently.  EKGs/Labs/Other Studies Reviewed Today:     Echocardiogram:  Ordered Results pending   Monitors:  14 day monitor August 2024-- my interpretation Sinus rhythm heart rate 38 to 136 bpm, average 65 bpm Rare (less than 1%) ectopy There were episodes of nonsustained VT  There were episodes of supraventricular tachycardia, most likely atrial runs.  The longest was 9 seconds There were multiple symptom episodes correlating with PVCs and PACs.  Some symptom episodes correlated with sinus rhythm    Advanced imaging:  CT coronary CAD RADS score 0.    EKG:   EKG Interpretation Date/Time:  Monday January 08 2024 10:46:43 EST Ventricular Rate:  70 PR Interval:  116 QRS Duration:  82 QT Interval:  426 QTC Calculation: 460 R Axis:   65  Text Interpretation: Sinus rhythm with frequent  Premature ventricular complexes Nonspecific ST abnormality When compared with ECG of 22-Nov-2023 15:35, Premature ventricular complexes are now Present Confirmed by York Pellant [phone]) on 01/08/2024 10:48:54 AM     Physical Exam:    VS:  BP 114/70 (BP Location: Right Arm, Patient Position: Sitting, Cuff Size: Normal)   Pulse 70   Ht 5' (1.524 m)    Wt 127 lb 9.6 oz (57.9 kg)   LMP 03/29/2021   SpO2 98%   BMI 24.92 kg/m     Wt Readings from Last 3 Encounters:  01/08/24 127 lb 9.6 oz (57.9 kg)  01/03/24 126 lb (57.2 kg)  12/01/23 126 lb 6.4 oz (57.3 kg)     GEN: Well nourished, well developed in no acute distress CARDIAC: RRR, no murmurs, rubs, gallops RESPIRATORY:  Normal work of breathing MUSCULOSKELETAL: no edema    ASSESSMENT & PLAN:     Rare PVCs Overall burden is low at 1%, but she is symptomatic, and PVCs are clustered, sometimes occurring in bigeminy Symptoms are mild  She is not interested in an antiarrhythmic drug Will try propranolol TTE ordered  PACs, atrial runs Also with overall low burden but symptomatic with occasional SVT, mild palpitations Again, not interested in an antiarrhythmic drug Will try propranolol  Hypertension She reports blood pressures are occasionally low Will decrease losartan to 25 mg to initiate propranolol She we will follow-up with Dr. Duke Salvia and PCP for further antihypertensive adjustments   Signed, Maurice Small, MD  01/08/2024 11:11 AM    [facility]

## 2024-01-31 ENCOUNTER — Ambulatory Visit (HOSPITAL_COMMUNITY): Attending: Cardiology

## 2024-01-31 DIAGNOSIS — I472 Ventricular tachycardia, unspecified: Secondary | ICD-10-CM | POA: Insufficient documentation

## 2024-01-31 DIAGNOSIS — R001 Bradycardia, unspecified: Secondary | ICD-10-CM | POA: Diagnosis present

## 2024-01-31 DIAGNOSIS — R9431 Abnormal electrocardiogram [ECG] [EKG]: Secondary | ICD-10-CM

## 2024-01-31 DIAGNOSIS — I471 Supraventricular tachycardia, unspecified: Secondary | ICD-10-CM | POA: Diagnosis present

## 2024-01-31 LAB — ECHOCARDIOGRAM COMPLETE
Area-P 1/2: 2.87 cm2
S' Lateral: 2.9 cm

## 2024-02-08 LAB — COMPREHENSIVE METABOLIC PANEL WITH GFR
ALT: 40 IU/L — ABNORMAL HIGH (ref 0–32)
AST: 27 IU/L (ref 0–40)
Albumin: 4.6 g/dL (ref 3.9–4.9)
Alkaline Phosphatase: 69 IU/L (ref 44–121)
BUN/Creatinine Ratio: 21 (ref 12–28)
BUN: 14 mg/dL (ref 8–27)
Bilirubin Total: 0.5 mg/dL (ref 0.0–1.2)
CO2: 23 mmol/L (ref 20–29)
Calcium: 9.7 mg/dL (ref 8.7–10.3)
Chloride: 103 mmol/L (ref 96–106)
Creatinine, Ser: 0.68 mg/dL (ref 0.57–1.00)
Globulin, Total: 2.3 g/dL (ref 1.5–4.5)
Glucose: 86 mg/dL (ref 70–99)
Potassium: 4.4 mmol/L (ref 3.5–5.2)
Sodium: 140 mmol/L (ref 134–144)
Total Protein: 6.9 g/dL (ref 6.0–8.5)
eGFR: 98 mL/min/{1.73_m2} (ref >59)

## 2024-02-08 LAB — LIPID PANEL
Chol/HDL Ratio: 2.4 ratio (ref 0.0–4.4)
Cholesterol, Total: 184 mg/dL (ref 100–199)
HDL: 77 mg/dL (ref >39)
LDL Chol Calc (NIH): 84 mg/dL (ref 0–99)
Triglycerides: 137 mg/dL (ref 0–149)
VLDL Cholesterol Cal: 23 mg/dL (ref 5–40)

## 2024-02-09 ENCOUNTER — Encounter: Payer: Self-pay | Admitting: Cardiovascular Disease

## 2024-02-19 ENCOUNTER — Other Ambulatory Visit (HOSPITAL_BASED_OUTPATIENT_CLINIC_OR_DEPARTMENT_OTHER): Payer: Self-pay | Admitting: Nurse Practitioner

## 2024-03-14 ENCOUNTER — Ambulatory Visit (INDEPENDENT_AMBULATORY_CARE_PROVIDER_SITE_OTHER): Payer: 59 | Admitting: Cardiovascular Disease

## 2024-03-14 ENCOUNTER — Encounter (HOSPITAL_BASED_OUTPATIENT_CLINIC_OR_DEPARTMENT_OTHER): Payer: Self-pay | Admitting: Cardiovascular Disease

## 2024-03-14 VITALS — BP 132/70 | HR 68 | Ht 60.0 in | Wt 128.2 lb

## 2024-03-14 DIAGNOSIS — G4733 Obstructive sleep apnea (adult) (pediatric): Secondary | ICD-10-CM | POA: Diagnosis not present

## 2024-03-14 DIAGNOSIS — I10 Essential (primary) hypertension: Secondary | ICD-10-CM | POA: Diagnosis not present

## 2024-03-14 DIAGNOSIS — I471 Supraventricular tachycardia, unspecified: Secondary | ICD-10-CM

## 2024-03-14 DIAGNOSIS — E7801 Familial hypercholesterolemia: Secondary | ICD-10-CM

## 2024-03-14 DIAGNOSIS — R001 Bradycardia, unspecified: Secondary | ICD-10-CM | POA: Diagnosis not present

## 2024-03-14 DIAGNOSIS — Z5181 Encounter for therapeutic drug level monitoring: Secondary | ICD-10-CM

## 2024-03-14 MED ORDER — PRAVASTATIN SODIUM 20 MG PO TABS
20.0000 mg | ORAL_TABLET | Freq: Every evening | ORAL | 3 refills | Status: DC
Start: 2024-03-14 — End: 2024-07-19
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
## 2024-03-14 NOTE — Patient Instructions (Signed)
 Medication Instructions:  STOP ROSUVASTATIN    IN 2 WEEKS START PRAVASTATIN 20 MG DAILY   *If you need a refill on your cardiac medications before your next appointment, please call your pharmacy*  Lab Work: FASTING LP/CMET IN 2 MONTHS   If you have labs (blood work) drawn today and your tests are completely normal, you will receive your results only by: MyChart Message (if you have MyChart) OR A paper copy in the mail If you have any lab test that is abnormal or we need to change your treatment, we will call you to review the results.  Testing/Procedures: NONE   Follow-Up: At [facility], you and your health needs are our priority.  As part of our continuing mission to provide you with exceptional heart care, our providers are all part of one team.  This team includes your primary Cardiologist (physician) and Advanced Practice Providers or APPs (Physician Assistants and Nurse Practitioners) who all work together to provide you with the care you need, when you need it.  Your next appointment:   4 month(s)  Provider:   Maudine Sos, MD, Slater Duncan, NP, or Neomi Banks, NP    We recommend signing up for the patient portal called "MyChart".  Sign up information is provided on this After Visit Summary.  MyChart is used to connect with patients for Virtual Visits (Telemedicine).  Patients are able to view lab/test results, encounter notes, upcoming appointments, etc.  Non-urgent messages can be sent to your provider as well.   To learn more about what you can do with MyChart, go to [email or web address].

## 2024-04-10 ENCOUNTER — Other Ambulatory Visit: Payer: Self-pay | Admitting: Family Medicine

## 2024-04-10 DIAGNOSIS — Z1231 Encounter for screening mammogram for malignant neoplasm of breast: Secondary | ICD-10-CM

## 2024-04-26 ENCOUNTER — Ambulatory Visit
Admission: RE | Admit: 2024-04-26 | Discharge: 2024-04-26 | Disposition: A | Discharge status: Home / Self Care | Source: Ambulatory Visit | Attending: Family Medicine | Admitting: Family Medicine

## 2024-04-26 DIAGNOSIS — Z1231 Encounter for screening mammogram for malignant neoplasm of breast: Secondary | ICD-10-CM

## 2024-07-11 ENCOUNTER — Encounter: Payer: 59 | Admitting: Family Medicine

## 2024-07-18 LAB — BASIC METABOLIC PANEL WITH GFR
BUN: 12 (ref 4–21)
CO2: 21 (ref 13–22)
Chloride: 103 (ref 99–108)
Creatinine: 0.6 (ref 0.5–1.1)
Glucose: 84
Potassium: 4.3 meq/L (ref 3.5–5.1)
Sodium: 139 (ref 137–147)

## 2024-07-18 LAB — CBC AND DIFFERENTIAL
HCT: 40 (ref 36–46)
Hemoglobin: 13.2 (ref 12.0–16.0)
Neutrophils Absolute: 2.6
Platelets: 248 K/uL (ref 150–400)
WBC: 5.6

## 2024-07-18 LAB — LIPID PANEL
Cholesterol: 250 — AB (ref 0–200)
HDL: 76 — AB (ref 35–70)
LDL Cholesterol: 145
Triglycerides: 165 — AB (ref 40–160)

## 2024-07-18 LAB — COMPREHENSIVE METABOLIC PANEL WITH GFR
Albumin: 4.3 (ref 3.5–5.0)
Calcium: 9.4 (ref 8.7–10.7)
Globulin: 2.1

## 2024-07-18 LAB — HEPATIC FUNCTION PANEL
ALT: 16 U/L (ref 7–35)
AST: 18 (ref 13–35)
Alkaline Phosphatase: 71 (ref 25–125)
Bilirubin, Total: 0.4

## 2024-07-18 LAB — CBC: RBC: 4.38 (ref 3.87–5.11)

## 2024-07-19 ENCOUNTER — Ambulatory Visit (HOSPITAL_BASED_OUTPATIENT_CLINIC_OR_DEPARTMENT_OTHER): Admitting: Family

## 2024-07-19 ENCOUNTER — Encounter (HOSPITAL_BASED_OUTPATIENT_CLINIC_OR_DEPARTMENT_OTHER): Payer: Self-pay | Admitting: Family

## 2024-07-19 VITALS — BP 134/82 | HR 67 | Ht 60.0 in | Wt 132.1 lb

## 2024-07-19 DIAGNOSIS — I10 Essential (primary) hypertension: Secondary | ICD-10-CM | POA: Diagnosis not present

## 2024-07-19 DIAGNOSIS — E782 Mixed hyperlipidemia: Secondary | ICD-10-CM | POA: Diagnosis not present

## 2024-07-19 NOTE — Progress Notes (Unsigned)
 Cardiology Office Note   Date:  07/19/2024  ID:  Sarah Beasley, DOB December 03, 1960, MRN [id number] PCP: Levora Reyes SAUNDERS, MD  [facility] Providers Cardiologist:  Annabella Scarce, MD Electrophysiologist:  Eulas FORBES Furbish, MD { Click to update primary MD,subspecialty MD or APP then REFRESH:1}    History of Present Illness Sarah Beasley is a 63 y.o. female with history of hypertension, OSA not on CPAP.  Established Dr. Scarce 01/24/2021.  Initial diagnosis of hypertension in 2015.  Nuclear stress test 2015 negative.  Intermittent from [place]  to  she stopped taking her medication initially BP stable.  However later antihypertensive regimen required including escalation of losartan .  Due to family history of neuroendocrine tumors (carcinoid) she was referred to [facility] Hypertension [facility].   Urine 5-HIAA normal. Hydralazine  utilized PRN for BP spikes. Re-referred to cardiology 07/03/23 for bradycardia as low as 40 bpm .14 day ZIO 07/2023 with 3 episodes of NSVT up to 19 beats at a rate of 231 bpm and 26 episodes of SVT up to 6 beats.  She was encouraged to treat OSA rather than start nodal blocking agents.  She was evaluated by Dr. Furbish of electrophysiology and not interested in antiarrhythmics and as such started on propranolol  and losartan  reduced. Echo ***  She was last in 03/14/2024.  She noted some leg cramping with rosuvastatin  and elevation in ALT.  She had not started beta-blocker due to concern about low heart rate given her history of bradycardia.  Given palpitations improved she preferred not to take propranolol .  Rosuvastatin  stopped for 2-week washout period and initiated on pravastatin  after washout period.  Presents today for follow up independently. Reports her palpitations and bradycardia have been resolved. Notes she was running around this morning which is why her BP may be high in clinic. BP readings at home 110s-120s. Reports her leg cramping is worse  with Pravastatin  than Rosuvastatin . She independently discontinued her Pravastatin . Notes her aunt ended up crippled related to statin which makes her understandably concerned. Since stopping Pravastatin  the leg pain has resolved.   Labs 07/19/24  total cholesterol 250, triglycerides 165, HDL 76, LDL 145.  creatinine 0.63, GFR 100, AST 18, ALT 16, K 4.3  hb 13.2, wbc 5.6, platelet 248    ROS: Please see the history of present illness.    All other systems reviewed and are negative.   Studies Reviewed      Cardiac Studies & Procedures   ______________________________________________________________________________________________     ECHOCARDIOGRAM  ECHOCARDIOGRAM COMPLETE 01/31/2024  Narrative ECHOCARDIOGRAM REPORT    Patient Name:   Sarah Beasley [facility] Date of Exam: 01/31/2024 Medical Rec #:  [id number]           Height:       60.0 in Accession #:    [id number]          Weight:       127.6 lb Date of Birth:  May 25, 1961          BSA:          1.542 m Patient Age:    62 years            BP:           114/70 mmHg Patient Gender: F                   HR:           53 bpm. Exam Location:  [address]  Procedure: 2D Echo, Cardiac  Doppler and Color Doppler (Both Spectral and Color Flow Doppler were utilized during procedure).  Indications:    R94.31 Abnormal EKG  History:        Patient has no prior history of Echocardiogram examinations. Arrythmias:Bradycardia, Signs/Symptoms:Murmur; Risk Factors:Hypertension.  Sonographer:    Augustin Seals RDCS Referring Phys: [id number] AUGUSTUS E MEALOR  IMPRESSIONS   1. Left ventricular ejection fraction, by estimation, is 55 to 60%. The left ventricle has normal function. The left ventricle has no regional wall motion abnormalities. There is mild left ventricular hypertrophy. Left ventricular diastolic parameters were grossly normal. 2. Right ventricular systolic function is normal. The right ventricular size is normal. There  is normal pulmonary artery systolic pressure. The estimated right ventricular systolic pressure is 28.6 mmHg. 3. Right atrial size was mildly dilated. 4. Bileaflet mitral valve prolapse with late systolic MR. Pickelhaube sign of the lateral annulus S' velocity, 16 cm/s. The mitral valve is myxomatous. Mild mitral valve regurgitation. No evidence of mitral stenosis. 5. Tricuspid valve regurgitation is mild to moderate. 6. The aortic valve is tricuspid. Aortic valve regurgitation is trivial. No aortic stenosis is present. 7. The inferior vena cava is normal in size with greater than 50% respiratory variability, suggesting right atrial pressure of 3 mmHg.  FINDINGS Left Ventricle: Left ventricular ejection fraction, by estimation, is 60 to 65%. The left ventricle has normal function. The left ventricle has no regional wall motion abnormalities. The left ventricular internal cavity size was normal in size. There is mild left ventricular hypertrophy. Left ventricular diastolic parameters were normal.  Right Ventricle: The right ventricular size is normal. No increase in right ventricular wall thickness. Right ventricular systolic function is normal. There is normal pulmonary artery systolic pressure. The tricuspid regurgitant velocity is 2.53 m/s, and with an assumed right atrial pressure of 3 mmHg, the estimated right ventricular systolic pressure is 28.6 mmHg.  Left Atrium: Left atrial size was normal in size.  Right Atrium: Right atrial size was mildly dilated.  Pericardium: There is no evidence of pericardial effusion.  Mitral Valve: Bileaflet mitral valve prolapse with late systolic MR. Pickelhaube sign of the lateral annulus S' velocity, 16 cm/s. The mitral valve is myxomatous. Mild mitral valve regurgitation. No evidence of mitral valve stenosis.  Tricuspid Valve: The tricuspid valve is grossly normal. Tricuspid valve regurgitation is mild to moderate. No evidence of tricuspid  stenosis.  Aortic Valve: The aortic valve is tricuspid. Aortic valve regurgitation is trivial. No aortic stenosis is present.  Pulmonic Valve: The pulmonic valve was normal in structure. Pulmonic valve regurgitation is trivial. No evidence of pulmonic stenosis.  Aorta: The aortic root is normal in size and structure.  Venous: The inferior vena cava is normal in size with greater than 50% respiratory variability, suggesting right atrial pressure of 3 mmHg.  IAS/Shunts: No atrial level shunt detected by color flow Doppler.   LEFT VENTRICLE PLAX 2D LVIDd:         4.70 cm   Diastology LVIDs:         2.90 cm   LV e' medial:    6.53 cm/s LV PW:         1.10 cm   LV E/e' medial:  7.9 LV IVS:        1.10 cm   LV e' lateral:   8.49 cm/s LVOT diam:     2.00 cm   LV E/e' lateral: 6.1 LV SV:         66 LV SV Index:  43 LVOT Area:     3.14 cm   RIGHT VENTRICLE             IVC RV Basal diam:  3.50 cm     IVC diam: 1.50 cm RV Mid diam:    3.10 cm RV S prime:     14.60 cm/s TAPSE (M-mode): 2.7 cm  LEFT ATRIUM             Index        RIGHT ATRIUM           Index LA diam:        4.20 cm 2.72 cm/m   RA Area:     19.30 cm LA Vol (A2C):   46.7 ml 30.28 ml/m  RA Volume:   57.40 ml  37.22 ml/m LA Vol (A4C):   46.8 ml 30.35 ml/m LA Biplane Vol: 46.3 ml 30.02 ml/m AORTIC VALVE LVOT Vmax:   94.50 cm/s LVOT Vmean:  55.100 cm/s LVOT VTI:    0.211 m  AORTA Ao Root diam: 2.90 cm Ao Asc diam:  2.90 cm  MITRAL VALVE               TRICUSPID VALVE MV Area (PHT): 2.87 cm    TR Peak grad:   25.6 mmHg MV Decel Time: 264 msec    TR Vmax:        253.00 cm/s MV E velocity: 51.50 cm/s MV A velocity: 50.70 cm/s  SHUNTS MV E/A ratio:  1.02        Systemic VTI:  0.21 m Systemic Diam: 2.00 cm  Soyla Merck MD Electronically signed by Soyla Merck MD Signature Date/Time: 01/31/2024/12:18:33 PM    Final    MONITORS  LONG TERM MONITOR (3-14 DAYS) 08/01/2023  Narrative 14 Day Zio  Monitor  Quality: Fair.  Baseline artifact. Predominant rhythm: sinus rhythm Average heart rate: 67 bpm Max heart rate: 136 bpm Min heart rate: 38 bpm Pauses >2.5 seconds: none   3 episodes of NSVT lasting up to 19 beats at a maximum rate of 231 bpm. 26 episodes of SVT lasting up to 6 beats Rare (<1%) PACs and PVCs Rare ventricular bigeminy  Tiffany C. Raford, MD, [facility] 08/21/2023 3:30 PM   CT SCANS  CT CORONARY MORPH W/CTA COR W/SCORE 12/13/2023  Addendum 12/28/2023  4:34 PM ADDENDUM REPORT: 12/28/2023 16:32  EXAM: OVER-READ INTERPRETATION  CT CHEST  The following report is an over-read performed by radiologist Dr. Andrea Gasman of [facility] on 12/28/2023. This over-read does not include interpretation of cardiac or coronary anatomy or pathology. The coronary CTA interpretation by the cardiologist is attached.  COMPARISON:  None.  FINDINGS: Vascular: No aortic atherosclerosis. The included aorta is normal in caliber.  Mediastinum/nodes: No adenopathy or mass.  Minimal hiatal hernia.  Lungs: No focal airspace disease. No pulmonary nodule. No pleural fluid. The included airways are patent.  Upper abdomen: No acute or unexpected findings.  Musculoskeletal: There are no acute or suspicious osseous abnormalities.  IMPRESSION: Minimal hiatal hernia.   Electronically Signed By: Andrea Gasman M.D. On: 12/28/2023 16:32  Narrative HISTORY: Dyspnea on exertion (DOE)  EXAM: Cardiac/Coronary CT  TECHNIQUE: The patient was scanned on a [employer].  PROTOCOL: A 100 kV prospective scan was triggered in the descending thoracic aorta at 111 HU's. Axial non-contrast 3 mm slices were carried out through the heart. The data set was analyzed on a dedicated work station and scored using the Agatston method. Gantry rotation  speed was 250 msecs and collimation was 0.6 mm. Heart rate was optimized medically and sl NTG was given. The 3D  data set was reconstructed in 5% intervals of the 35-75 % of the R-R cycle. Systolic and diastolic phases were analyzed on a dedicated work station using MPR, MIP and VRT modes. The patient received 95mL OMNIPAQUE  IOHEXOL  350 MG/ML SOLN of contrast.  FINDINGS: Coronary calcium  score: The patient's coronary artery calcium  score is 0, which places the patient in the 0 percentile.  Coronary arteries: Normal coronary origins.  Left dominance.  Right Coronary Artery: Small caliber, nondominant vessel . No significant plaque or stenosis.  Left Main Coronary Artery: Normal caliber vessel. No significant plaque or stenosis.  Left Anterior Descending Coronary Artery: Normal caliber vessel. No significant plaque or stenosis. Gives rise to normal first, small second diagonal branches. Distal vessel wraps apex.  Left Circumflex Artery: Normal caliber vessel, gives rise to PDA. No significant plaque or stenosis. Gives rise to small first, large second OM branches.  Aorta: Normal size, 27 mm at the mid ascending aorta (level of the PA bifurcation) measured double oblique. No aortic atherosclerosis. No dissection seen in visualized portions of the aorta.  Aortic Valve: No calcifications. Trileaflet.  Other findings:  Normal pulmonary vein drainage into the left atrium.  Normal left atrial appendage without a thrombus.  Normal size of the pulmonary artery.  Normal appearance of the pericardium.  IMPRESSION: 1. No evidence of CAD, CADRADS = 0.  2. Coronary calcium  score of 0. This was 0th percentile for age-, sex-, and race- matched controls.  3. No plaque detected.  4. Normal coronary origin with left dominance.  INTERPRETATION:  CAD-RADS 0: No evidence of CAD (0%). Consider non-atherosclerotic causes of chest pain.  Electronically Signed: By: Shelda Bruckner M.D. On: 12/14/2023 18:36      ______________________________________________________________________________________________      Risk Assessment/Calculations           Physical Exam VS:  BP 134/82   Pulse 67   Ht 5' (1.524 m)   Wt 132 lb 1.6 oz (59.9 kg)   LMP 03/29/2021   SpO2 97%   BMI 25.80 kg/m        Wt Readings from Last 3 Encounters:  07/19/24 132 lb 1.6 oz (59.9 kg)  03/14/24 128 lb 3.2 oz (58.2 kg)  01/08/24 127 lb 9.6 oz (57.9 kg)    GEN: Well nourished, well developed in no acute distress NECK: No JVD; No carotid bruits CARDIAC: RRR, no murmurs, rubs, gallops RESPIRATORY:  Clear to auscultation without rales, wheezing or rhonchi  ABDOMEN: Soft, non-tender, non-distended EXTREMITIES:  No edema; No deformity   ASSESSMENT AND PLAN  Bradycardia/SVT/VT- Quiescent. Previously preferred to avoid antiarrhythmics ***  HLD- Did not tolerate Rosuvastatin  nor Pravastatin  with myalgias. ***  Elevated liver enzyme- 02/07/24 alkaline phosphatase 69, *** Prefers lifestyle changes. Given information on Zetia and Bempedoic acid to review for follow up. ***  HTN- BP at goal <130/80 by home readings. Continue Losartan  25mg  daily.        Dispo: follow up in 4 months  Signed, Reche GORMAN Finder, NP

## 2024-07-19 NOTE — Patient Instructions (Signed)
 Medication Instructions:   DISCONTINUE Pravastatin .   *If you need a refill on your cardiac medications before your next appointment, please call your pharmacy*  Lab Work:  Your physician recommends that you return for a FASTING lipid profile, fasting after midnight in January.    If you have labs (blood work) drawn today and your tests are completely normal, you will receive your results only by: MyChart Message (if you have MyChart) OR A paper copy in the mail If you have any lab test that is abnormal or we need to change your treatment, we will call you to review the results.  Testing/Procedures:  None ordered.   Follow-Up: At [facility], you and your health needs are our priority.  As part of our continuing mission to provide you with exceptional heart care, our providers are all part of one team.  This team includes your primary Cardiologist (physician) and Advanced Practice Providers or APPs (Physician Assistants and Nurse Practitioners) who all work together to provide you with the care you need, when you need it.  Your next appointment:   4 month(s)  Provider:   Annabella Scarce, MD, Rosaline Bane, NP, or Reche Finder, NP    We recommend signing up for the patient portal called MyChart.  Sign up information is provided on this After Visit Summary.  MyChart is used to connect with patients for Virtual Visits (Telemedicine).  Patients are able to view lab/test results, encounter notes, upcoming appointments, etc.  Non-urgent messages can be sent to your provider as well.   To learn more about what you can do with MyChart, go to [email or web address].   Other Instructions  Your physician wants you to follow-up in: 4 months.  You will receive a reminder letter in the mail two months in advance. If you don't receive a letter, please call our office to schedule the follow-up appointment.           Ezetimibe Tablets What is this medication? EZETIMIBE  (ez ET i mibe) treats high cholesterol. It works by reducing the amount of cholesterol absorbed from the food you eat. This decreases the amount of bad cholesterol (such as LDL) in your blood. Changes to diet and exercise are often combined with this medication. This medicine may be used for other purposes; ask your health care provider or pharmacist if you have questions. COMMON BRAND NAME(S): Zetia What should I tell my care team before I take this medication? They need to know if you have any of these conditions: Kidney disease Liver disease Muscle cramps, pain Muscle injury Thyroid  disease An unusual or allergic reaction to ezetimibe, other medications, foods, dyes, or preservatives Pregnant or trying to get pregnant Breast-feeding How should I use this medication? Take this medication by mouth. Take it as directed on the prescription label at the same time every day. You can take it with or without food. If it upsets your stomach, take it with food. Keep taking it unless your care team tells you to stop. Take bile acid sequestrants at a different time of day than this medication. Take this medication 2 hours BEFORE or 4 hours AFTER bile acid sequestrants. Talk to your care team about the use of this medication in children. While it may be prescribed for children as young as 10 for selected conditions, precautions do apply. Overdosage: If you think you have taken too much of this medicine contact a poison control center or emergency room at once. NOTE: This medicine is only for  you. Do not share this medicine with others. What if I miss a dose? If you miss a dose, take it as soon as you can. If it is almost time for your next dose, take only that dose. Do not take double or extra doses. What may interact with this medication? Do not take this medication with any of the following: Fenofibrate Gemfibrozil This medication may also interact with the following: Antacids Cyclosporine Herbal  medications like red yeast rice Other medications to lower cholesterol or triglycerides This list may not describe all possible interactions. Give your health care provider a list of all the medicines, herbs, non-prescription drugs, or dietary supplements you use. Also tell them if you smoke, drink alcohol, or use illegal drugs. Some items may interact with your medicine. What should I watch for while using this medication? Visit your care team for regular checks on your progress. Tell your care team if your symptoms do not start to get better or if they get worse. Your care team may tell you to stop taking this medication if you develop muscle problems. If your muscle problems do not go away after stopping this medication, contact your care team. Talk to your care team if you wish to become pregnant or think you might be pregnant. This medication can cause serious birth defects if taken during pregnancy. Talk to your care team before breastfeeding. Changes to your treatment plan may be needed. Taking this medication is only part of a total heart healthy program. Ask your care team if there are other changes you can make to improve your overall health. What side effects may I notice from receiving this medication? Side effects that you should report to your doctor or health care provider as soon as possible: Allergic reactions--skin rash, itching or hives, swelling of the face, lips, tongue, or throat Side effects that usually do not require medical attention (report to your doctor or health care provider if they continue or are bothersome): Diarrhea Joint pain This list may not describe all possible side effects. Call your doctor for medical advice about side effects. You may report side effects to FDA at 1-800-FDA-1088. Where should I keep my medication? Keep out of the reach of children and pets.   Store at room temperature between 15 and 30 degrees C (59 and 86 degrees F). Protect from  moisture. Get rid of any unused medication after the expiration date. NOTE: This sheet is a summary. It may not cover all possible information. If you have questions about this medicine, talk to your doctor, pharmacist, or health care provider.  2024 Elsevier/Gold Standard (2022-08-02 00:00:00)

## 2024-09-02 ENCOUNTER — Ambulatory Visit: Admitting: Family Medicine

## 2024-09-02 ENCOUNTER — Encounter: Payer: Self-pay | Admitting: Family Medicine

## 2024-09-02 VITALS — BP 114/70 | HR 73 | Temp 98.0°F | Ht 60.0 in | Wt 132.8 lb

## 2024-09-02 DIAGNOSIS — Z23 Encounter for immunization: Secondary | ICD-10-CM | POA: Diagnosis not present

## 2024-09-02 DIAGNOSIS — Z0000 Encounter for general adult medical examination without abnormal findings: Secondary | ICD-10-CM | POA: Diagnosis not present

## 2024-09-02 NOTE — Progress Notes (Unsigned)
 Subjective:  Patient ID: Sarah Beasley, female    DOB: Dec 20, 1960  Age: 63 y.o. MRN: [id number]  CC:  Chief Complaint  Patient presents with  . Annual Exam    Pt is not fasting; labs completed on 9/11 ([facility]), pt brought a copy     HPI Sarah Beasley presents for Annual Exam  No health changes, no concerns.   PCP, me cardiology, Dr. Raford, hypertension, SVT, OSA, bradycardia, familial hyperlipidemia.  Was seen by Reche Finder September 12.  Prior palpitations and bradycardia resolved.  Home BP readings 110-120s.  Had discontinued pravastatin  due to leg pains, that were worse than rosuvastatin .  Preferred lifestyle changes.  Plan for repeat lipids in January, then consider bempedoic acid or Zetia.  Continue losartan  25 mg daily.  Prior coronary calcium  score of 0.  Was not able to tolerate CPAP.  Reported mild OSA previously. Electrophysiology, Dr. Nancey.  SVT with PVCs, PACs.  Appointment in March, decreased her losartan  at that time and started propranolol  ER 60 mg daily.  Low burden but PVCs were clustered, symptomatic, not interested in antiarrhythmic drug. She did not start propanolol. No palpitations.  Gynecology, [facility], Pap testing last December.  Sleeping ok.   Outside labs performed on 07/18/2024.  Total cholesterol 250, triglyceride 165, LDL 145 HDL 76.  CMP normal with creatinine 0.63 and normal LFTs.  CBC normal.  Hemoglobin 13.2.    09/02/2024    1:06 PM 01/03/2024    8:24 AM 07/03/2023    8:46 AM 06/06/2022    8:04 AM 11/24/2021    2:35 PM  Depression screen PHQ 2/9  Decreased Interest 0 0 0 0 0  Down, Depressed, Hopeless 0 0 0 0 0  PHQ - 2 Score 0 0 0 0 0  Altered sleeping 1 2  1 1   Tired, decreased energy 0 0  1 1  Change in appetite 0 0  1 0  Feeling bad or failure about yourself  0 0  0 0  Trouble concentrating 0 0  0 0  Moving slowly or fidgety/restless 0 0  0 0  Suicidal thoughts 0 0  0 0  PHQ-9 Score 1 2  3 2   Difficult  doing work/chores Not difficult at all        Health Maintenance  Topic Date Due  . Pneumococcal Vaccine: 50+ Years (1 of 1 - PCV) Never done  . COVID-19 Vaccine (7 - 2025-26 season) 09/18/2024 (Originally 07/08/2024)  . DTaP/Tdap/Td (2 - Td or Tdap) 04/14/2026  . Mammogram  04/26/2026  . Cervical Cancer Screening (HPV/Pap Cotest)  07/05/2026  . Colonoscopy  04/10/2031  . Influenza Vaccine  Completed  . Hepatitis C Screening  Completed  . HIV Screening  Completed  . Zoster Vaccines- Shingrix   Completed  . Hepatitis B Vaccines 19-59 Average Risk  Aged Out  . HPV VACCINES  Aged Out  . Meningococcal B Vaccine  Aged Out  Mammogram 04/26/24 Pap at GYN in 10/2023.  Colonoscopy in 2022 - repeat 5 years with polyps - hyperplastic  Derm: has seen in past.  No new skin lesions. Plans follow up - she will schedule.   Immunization History  Administered Date(s) Administered  . Hepatitis A, Adult 01/04/2018  . Influenza Inj Mdck Quad Pf 08/27/2019  . Influenza, Mdck, Trivalent,PF 6+ MOS(egg free) 07/16/2024  . Influenza,inj,Quad PF,6+ Mos 09/24/2015, 01/04/2018, 12/05/2018, 08/22/2022  . Influenza-Unspecified 09/02/2020, 08/23/2021, 09/15/2023  . Moderna Covid-19 Fall Seasonal Vaccine 38yrs &  older 08/28/2022  . PFIZER Comirnaty(Gray Top)Covid-19 Tri-Sucrose Vaccine 01/27/2020, 02/24/2020, 09/02/2020, 08/23/2021  . Rabies, IM 09/10/2018, 09/13/2018, 09/17/2018, 09/24/2018  . Tdap 04/14/2016  . Typhoid Live 01/04/2018  . Unspecified SARS-COV-2 Vaccination 09/12/2023  . Zoster Recombinant(Shingrix ) 09/17/2020, 12/24/2021  Flu vaccine in September.  Covid booster- plans at pharmacy.  Prevnar today.   No results found. Wears glasses - yearly appt - within last 6 months.   Dental: every 6 months.   Alcohol: up to 2 per day, 8 in a week.   Tobacco: none  Exercise: walking 5 days/week. Volunteering at [employer].   History Patient Active Problem List   Diagnosis Date  Noted  . Exertional dyspnea 12/01/2023  . SVT (supraventricular tachycardia) 12/01/2023  . Familial hypercholesterolemia 07/07/2023  . Bradycardia 08/04/2021  . Essential hypertension 02/01/2021  . OSA (obstructive sleep apnea) 02/01/2021  . Heart murmur 10/08/2020  . Anemia 10/08/2020  . Hematuria 07/07/2017   Past Medical History:  Diagnosis Date  . Anemia 10/08/2020  . Bradycardia 08/04/2021  . Essential hypertension 02/01/2021  . Exertional dyspnea 12/01/2023  . Familial hypercholesterolemia 07/07/2023  . Heart murmur    past history  . Hypertension   . OSA (obstructive sleep apnea) 02/01/2021  . Sleep apnea    does not wear cpap as of yet. backordered  . SVT (supraventricular tachycardia) 12/01/2023   Past Surgical History:  Procedure Laterality Date  . KNEE SURGERY Left   . MOUTH SURGERY     Allergies  Allergen Reactions  . Crestor  [Rosuvastatin ]     MYALGIAS    Prior to Admission medications   Medication Sig Start Date End Date Taking? Authorizing Provider  augmented betamethasone dipropionate (DIPROLENE-AF) 0.05 % cream Apply 1 Application topically daily as needed. 06/27/23  Yes [provider]  Ibuprofen (ADVIL PO) Take by mouth. Patient taking differently: Take by mouth as needed.   Yes [provider]  losartan  (COZAAR ) 25 MG tablet Take 1 tablet (25 mg total) by mouth daily. 01/08/24 09/02/24 Yes Mealor, Augustus E, MD  Multiple Vitamin (MULTIVITAMIN) tablet Take 1 tablet by mouth daily.   Yes [provider]   Social History   Socioeconomic History  . Marital status: Single    Spouse name: Not on file  . Number of children: 0  . Years of education: BA  . Highest education level: Bachelor's degree (e.g., BA, AB, BS)  Occupational History  . Not on file  Tobacco Use  . Smoking status: Never    Passive exposure: Yes  . Smokeless tobacco: Never  . 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 Vaping status: Never Used  Substance and Sexual Activity  . Alcohol use: Yes    Alcohol/week: 10.0 standard drinks of alcohol    Types: 10 Glasses of wine per week    Comment: 10 glasses through out the week / 2 bottles wine  . Drug use: No  . Sexual activity: Not Currently    Birth control/protection: Post-menopausal  Other Topics Concern  . Not on file  Social History Narrative   Exercise walking for 1 hour   Drinks 1 cup of coffee in the morning.    Social Drivers of Corporate Investment Banker Strain: Low Risk  (08/30/2024)   Overall Financial Resource Strain (CARDIA)   . Difficulty of Paying Living Expenses: Not very hard  Food Insecurity: No Food Insecurity (08/30/2024)   Hunger Vital Sign   . Worried About Programme Researcher, Broadcasting/film/video in the Last Year: Never true   . Ran Out of Food in the Last Year: Never true  Transportation Needs: No Transportation Needs (08/30/2024)   PRAPARE - Transportation   . Lack of Transportation (Medical): No   . Lack of Transportation (Non-Medical): No  Physical Activity: Sufficiently Active (08/30/2024)   Exercise Vital Sign   . Days of Exercise per Week: 5 days   . Minutes of Exercise per Session: 60 min  Stress: No Stress Concern Present (08/30/2024)   [employer] of Occupational Health - Occupational Stress Questionnaire   . Feeling of Stress: Not at all  Social Connections: Moderately Integrated (08/30/2024)   Social Connection and Isolation Panel   . Frequency of Communication with Friends and Family: Three times a week   . Frequency of Social Gatherings with Friends and Family: Once a week   . Attends Religious Services: More than 4 times per year   . Active Member of Clubs or Organizations: Yes   . Attends Banker Meetings: More than 4 times per year   . Marital Status: Never married  Intimate Partner Violence: Not on file    Review of Systems   Objective:    Vitals:   09/02/24 1302  BP: 114/70  Pulse: 73  Temp: 98 F (36.7 C)  TempSrc: Oral  SpO2: 97%  Weight: 132 lb 12.8 oz (60.2 kg)  Height: 5' (1.524 m)   {Vitals History (Optional):23777}  Physical Exam     Assessment & Plan:  Akaylah Lalley is a 63 y.o. female . Need for pneumococcal 20-valent conjugate vaccination - Plan: Pneumococcal conjugate vaccine 20-valent (Prevnar 20)   No orders of the defined types were placed in this encounter.  There are no Patient Instructions on file for this visit.    Signed,   Reyes Pines, MD [facility], [facility] 09/02/24 1:13 PM

## 2024-09-02 NOTE — Patient Instructions (Addendum)
 Check with gastroenterology on timing of repeat colonoscopy.  You did have hyperplastic polyps which are usually not precancerous, but I do see that they had recommended 5-year follow-up instead of 10-year follow-up.  Also call to schedule the dermatology follow-up.  Let me know if you need a new referral.  No labs today based on your most recent lab work in September.  Can check labs at next visit if needed.  No change in meds for now.  Let me know if you have questions and take care!  Preventive Care 63-42 Years Old, Female Preventive care refers to lifestyle choices and visits with your health care provider that can promote health and wellness. Preventive care visits are also called wellness exams. What can I expect for my preventive care visit? Counseling Your health care provider may ask you questions about your: Medical history, including: Past medical problems. Family medical history. Pregnancy history. Current health, including: Menstrual cycle. Method of birth control. Emotional well-being. Home life and relationship well-being. Sexual activity and sexual health. Lifestyle, including: Alcohol, nicotine or tobacco, and drug use. Access to firearms. Diet, exercise, and sleep habits. Work and work astronomer. Sunscreen use. Safety issues such as seatbelt and bike helmet use. Physical exam Your health care provider will check your: Height and weight. These may be used to calculate your BMI (body mass index). BMI is a measurement that tells if you are at a healthy weight. Waist circumference. This measures the distance around your waistline. This measurement also tells if you are at a healthy weight and may help predict your risk of certain diseases, such as type 2 diabetes and high blood pressure. Heart rate and blood pressure. Body temperature. Skin for abnormal spots. What immunizations do I need?  Vaccines are usually given at various ages, according to a schedule. Your  health care provider will recommend vaccines for you based on your age, medical history, and lifestyle or other factors, such as travel or where you work. What tests do I need? Screening Your health care provider may recommend screening tests for certain conditions. This may include: Lipid and cholesterol levels. Diabetes screening. This is done by checking your blood sugar (glucose) after you have not eaten for a while (fasting). Pelvic exam and Pap test. Hepatitis B test. Hepatitis C test. HIV (human immunodeficiency virus) test. STI (sexually transmitted infection) testing, if you are at risk. Lung cancer screening. Colorectal cancer screening. Mammogram. Talk with your health care provider about when you should start having regular mammograms. This may depend on whether you have a family history of breast cancer. BRCA-related cancer screening. This may be done if you have a family history of breast, ovarian, tubal, or peritoneal cancers. Bone density scan. This is done to screen for osteoporosis. Talk with your health care provider about your test results, treatment options, and if necessary, the need for more tests. Follow these instructions at home: Eating and drinking  Eat a diet that includes fresh fruits and vegetables, whole grains, lean protein, and low-fat dairy products. Take vitamin and mineral supplements as recommended by your health care provider. Do not drink alcohol if: Your health care provider tells you not to drink. You are pregnant, may be pregnant, or are planning to become pregnant. If you drink alcohol: Limit how much you have to 0-1 drink a day. Know how much alcohol is in your drink. In the [place] one drink equals one 12 oz bottle of beer (355 mL), one 5 oz glass of wine (148  mL), or one 1 oz glass of hard liquor (44 mL). Lifestyle Brush your teeth every morning and night with fluoride toothpaste. Floss one time each day. Exercise for at least 30 minutes 5 or  more days each week. Do not use any products that contain nicotine or tobacco. These products include cigarettes, chewing tobacco, and vaping devices, such as e-cigarettes. If you need help quitting, ask your health care provider. Do not use drugs. If you are sexually active, practice safe sex. Use a condom or other form of protection to prevent STIs. If you do not wish to become pregnant, use a form of birth control. If you plan to become pregnant, see your health care provider for a prepregnancy visit. Take aspirin only as told by your health care provider. Make sure that you understand how much to take and what form to take. Work with your health care provider to find out whether it is safe and beneficial for you to take aspirin daily. Find healthy ways to manage stress, such as: Meditation, yoga, or listening to music. Journaling. Talking to a trusted person. Spending time with friends and family. Minimize exposure to UV radiation to reduce your risk of skin cancer. Safety Always wear your seat belt while driving or riding in a vehicle. Do not drive: If you have been drinking alcohol. Do not ride with someone who has been drinking. When you are tired or distracted. While texting. If you have been using any mind-altering substances or drugs. Wear a helmet and other protective equipment during sports activities. If you have firearms in your house, make sure you follow all gun safety procedures. Seek help if you have been physically or sexually abused. What's next? Visit your health care provider once a year for an annual wellness visit. Ask your health care provider how often you should have your eyes and teeth checked. Stay up to date on all vaccines. This information is not intended to replace advice given to you by your health care provider. Make sure you discuss any questions you have with your health care provider. Document Revised: 04/21/2021 Document Reviewed: 04/21/2021 Elsevier  Patient Education  2024 [employer].

## 2024-10-16 ENCOUNTER — Encounter (HOSPITAL_BASED_OUTPATIENT_CLINIC_OR_DEPARTMENT_OTHER): Payer: Self-pay | Admitting: Cardiovascular Disease

## 2024-10-16 ENCOUNTER — Encounter: Payer: Self-pay | Admitting: Cardiovascular Disease

## 2025-01-10 ENCOUNTER — Ambulatory Visit (HOSPITAL_BASED_OUTPATIENT_CLINIC_OR_DEPARTMENT_OTHER): Admitting: Family

## 2025-09-03 ENCOUNTER — Encounter: Admitting: Family Medicine
